# Patient Record
Sex: Female | Born: 1990 | Hispanic: Yes | Marital: Single | Smoking: Never smoker
Health system: Southern US, Community
[De-identification: ages and names within clinical notes are randomized; demographics above are authoritative.]

## PROBLEM LIST (undated history)

## (undated) DIAGNOSIS — N200 Calculus of kidney: Secondary | ICD-10-CM

## (undated) DIAGNOSIS — K759 Inflammatory liver disease, unspecified: Secondary | ICD-10-CM

## (undated) HISTORY — PX: KIDNEY STONE SURGERY: SHX686

---

## 2017-05-03 ENCOUNTER — Encounter (HOSPITAL_COMMUNITY): Payer: Self-pay

## 2017-05-03 ENCOUNTER — Emergency Department (HOSPITAL_COMMUNITY): Payer: Self-pay

## 2017-05-03 ENCOUNTER — Emergency Department (HOSPITAL_COMMUNITY)
Admission: EM | Admit: 2017-05-03 | Discharge: 2017-05-03 | Disposition: A | Discharge status: Home / Self Care | Payer: Self-pay | Attending: Emergency Medicine | Admitting: Emergency Medicine

## 2017-05-03 DIAGNOSIS — I722 Aneurysm of renal artery: Secondary | ICD-10-CM | POA: Insufficient documentation

## 2017-05-03 DIAGNOSIS — R110 Nausea: Secondary | ICD-10-CM | POA: Insufficient documentation

## 2017-05-03 DIAGNOSIS — R109 Unspecified abdominal pain: Secondary | ICD-10-CM

## 2017-05-03 DIAGNOSIS — Z87442 Personal history of urinary calculi: Secondary | ICD-10-CM | POA: Insufficient documentation

## 2017-05-03 DIAGNOSIS — N200 Calculus of kidney: Secondary | ICD-10-CM | POA: Insufficient documentation

## 2017-05-03 DIAGNOSIS — R300 Dysuria: Secondary | ICD-10-CM | POA: Insufficient documentation

## 2017-05-03 HISTORY — DX: Calculus of kidney: N20.0

## 2017-05-03 HISTORY — DX: Inflammatory liver disease, unspecified: K75.9

## 2017-05-03 LAB — URINALYSIS, ROUTINE W REFLEX MICROSCOPIC
Bilirubin Urine: NEGATIVE
Glucose, UA: NEGATIVE mg/dL
Ketones, ur: NEGATIVE mg/dL
Leukocytes, UA: NEGATIVE
Nitrite: NEGATIVE
Protein, ur: NEGATIVE mg/dL
RBC / HPF: NONE SEEN RBC/hpf (ref 0–5)
Specific Gravity, Urine: 1 — ABNORMAL LOW (ref 1.005–1.030)
pH: 7 (ref 5.0–8.0)

## 2017-05-03 LAB — CBC WITH DIFFERENTIAL/PLATELET
BASOS PCT: 0 %
Basophils Absolute: 0 10*3/uL (ref 0.0–0.1)
Eosinophils Absolute: 0.2 10*3/uL (ref 0.0–0.7)
Eosinophils Relative: 2 %
HEMATOCRIT: 36.7 % (ref 36.0–46.0)
Hemoglobin: 12 g/dL (ref 12.0–15.0)
LYMPHS ABS: 2.4 10*3/uL (ref 0.7–4.0)
LYMPHS PCT: 30 %
MCH: 28.1 pg (ref 26.0–34.0)
MCHC: 32.7 g/dL (ref 30.0–36.0)
MCV: 85.9 fL (ref 78.0–100.0)
MONO ABS: 0.7 10*3/uL (ref 0.1–1.0)
MONOS PCT: 9 %
NEUTROS ABS: 4.8 10*3/uL (ref 1.7–7.7)
Neutrophils Relative %: 59 %
Platelets: 333 10*3/uL (ref 150–400)
RBC: 4.27 MIL/uL (ref 3.87–5.11)
RDW: 13.2 % (ref 11.5–15.5)
WBC: 8 10*3/uL (ref 4.0–10.5)

## 2017-05-03 LAB — I-STAT BETA HCG BLOOD, ED (MC, WL, AP ONLY): I-stat hCG, quantitative: <5 m[IU]/mL (ref <5)

## 2017-05-03 LAB — COMPREHENSIVE METABOLIC PANEL
ALT: 9 U/L — ABNORMAL LOW (ref 14–54)
AST: 17 U/L (ref 15–41)
Albumin: 4.4 g/dL (ref 3.5–5.0)
Alkaline Phosphatase: 75 U/L (ref 38–126)
Anion gap: 5 (ref 5–15)
BUN: 10 mg/dL (ref 6–20)
CO2: 28 mmol/L (ref 22–32)
Calcium: 9.1 mg/dL (ref 8.9–10.3)
Chloride: 105 mmol/L (ref 101–111)
Creatinine, Ser: 0.74 mg/dL (ref 0.44–1.00)
GFR calc Af Amer: >60 mL/min (ref >60)
GFR calc non Af Amer: >60 mL/min (ref >60)
Glucose, Bld: 99 mg/dL (ref 65–99)
Potassium: 3.6 mmol/L (ref 3.5–5.1)
Sodium: 138 mmol/L (ref 135–145)
Total Bilirubin: 0.4 mg/dL (ref 0.3–1.2)
Total Protein: 7.4 g/dL (ref 6.5–8.1)

## 2017-05-03 LAB — LIPASE, BLOOD: Lipase: 33 U/L (ref 11–51)

## 2017-05-03 MED ORDER — HYDROCODONE-ACETAMINOPHEN 5-325 MG PO TABS: 1.0000 | ORAL_TABLET | Freq: Four times a day (QID) | ORAL | 0 refills | Status: DC | PRN

## 2017-05-03 MED ORDER — SODIUM CHLORIDE 0.9 % IV BOLUS (SEPSIS)
1000.0000 mL | Freq: Once | INTRAVENOUS | Status: AC
  Administered 2017-05-03: 1000 mL via INTRAVENOUS

## 2017-05-03 MED ORDER — TAMSULOSIN HCL 0.4 MG PO CAPS: 0.4000 mg | ORAL_CAPSULE | Freq: Every day | ORAL | 0 refills | Status: DC

## 2017-05-03 MED ORDER — ONDANSETRON HCL 8 MG PO TABS: 8.0000 mg | ORAL_TABLET | Freq: Three times a day (TID) | ORAL | 0 refills | Status: DC | PRN

## 2017-05-03 MED ORDER — NAPROXEN 500 MG PO TABS: 500.0000 mg | ORAL_TABLET | Freq: Two times a day (BID) | ORAL | 0 refills | Status: DC | PRN

## 2017-05-03 MED ORDER — ONDANSETRON HCL 4 MG/2ML IJ SOLN
4.0000 mg | Freq: Once | INTRAMUSCULAR | Status: AC
  Administered 2017-05-03: 4 mg via INTRAVENOUS
  Filled 2017-05-03: qty 2

## 2017-05-03 MED ORDER — KETOROLAC TROMETHAMINE 30 MG/ML IJ SOLN
15.0000 mg | Freq: Once | INTRAMUSCULAR | Status: AC
  Administered 2017-05-03: 15 mg via INTRAVENOUS
  Filled 2017-05-03: qty 1

## 2017-05-03 NOTE — ED Provider Notes (Signed)
[facility]-EMERGENCY DEPT Provider Note   CSN: [id number] Arrival date & time: 05/03/17  1427     History   Chief Complaint Chief Complaint  Patient presents with  . Abdominal Pain  . Back Pain    HPI Emily Madden Emily Madden is a 26 y.o. female with a PMHx of hepatitis and kidney stones, with PSHx of kidney stone surgery (L sided open stent placement for 3cm stone, and lithotripsy, done in 2014 in [place]), who is here visiting from [place], and presents to the ED with complaints of right flank pain that originally began on 04/30/17 in the early morning hours. She states that initially began on the right flank and then became more lower back radiating to her lower abdomen, she felt like this pain was similar to kidney stone pain so she drank a warm beer and took naproxen, and after about 2-3 hours her pain resolved. She felt well until around 11:40 AM her right flank pain began again and it was worse than previous, and feels exactly like prior kidney stones. She describes the pain is 8/10 waxing and waning constant sharp right flank pain that radiates to her lower abdomen, worse with movement, and unrelieved with naproxen. She reports associated dysuria and burning vaginal pain when she wipes, dark malodorous urine, and nausea. States her urine has cleared up a lot because she drank 4 bottles of water. LMP 04/22/17. Currently visiting from [place], doesn't have a PCP here. No other prior abd surgeries. Denies recent travel except from coming from [place], sick contacts, suspicious food intake, or frequent EtOH use.   She denies fevers, chills, CP, SOB, V/D/C, obstipation, hematuria, vaginal bleeding/discharge, myalgias, arthralgias, numbness, tingling, focal weakness, or any other complaints at this time.    The history is provided by the patient and medical records. A language interpreter was used (provider).  Flank Pain  This is a new problem. The current episode started more than 2 days ago. The  problem occurs constantly. The problem has been gradually worsening. Associated symptoms include abdominal pain. Pertinent negatives include no chest pain and no shortness of breath. Exacerbated by: movement. Nothing relieves the symptoms. Treatments tried: naprosyn. The treatment provided no relief.    Past Medical History:  Diagnosis Date  . Hepatitis   . Kidney stones     There are no active problems to display for this patient.   Past Surgical History:  Procedure Laterality Date  . KIDNEY STONE SURGERY      OB History    No data available       Home Medications    Prior to Admission medications   Not on File    Family History History reviewed. No pertinent family history.  Social History Social History  Substance Use Topics  . Smoking status: Never Smoker  . Smokeless tobacco: Never Used  . Alcohol use Yes     Comment: social     Allergies   Patient has no known allergies.   Review of Systems Review of Systems  Constitutional: Negative for chills and fever.  Respiratory: Negative for shortness of breath.   Cardiovascular: Negative for chest pain.  Gastrointestinal: Positive for abdominal pain and nausea. Negative for constipation, diarrhea and vomiting.  Genitourinary: Positive for dysuria, flank pain and vaginal pain (burning with wiping and urination). Negative for hematuria, vaginal bleeding and vaginal discharge.       +dark malodorous urine  Musculoskeletal: Negative for arthralgias and myalgias.  Skin: Negative for color change.  Allergic/Immunologic: Negative  for immunocompromised state.  Neurological: Negative for weakness and numbness.  Psychiatric/Behavioral: Negative for confusion.   All other systems reviewed and are negative for acute change except as noted in the HPI.    Physical Exam Updated Vital Signs BP 108/62 (BP Location: Left Arm)   Pulse 60   Temp (!) 97.5 F (36.4 C) (Oral)   Resp 16   LMP 04/23/2017   SpO2 100%    Physical Exam  Constitutional: She is oriented to person, place, and time. Vital signs are normal. She appears well-developed and well-nourished.  Non-toxic appearance. No distress.  Afebrile, nontoxic, NAD  HENT:  Head: Normocephalic and atraumatic.  Mouth/Throat: Oropharynx is clear and moist and mucous membranes are normal.  Eyes: Conjunctivae and EOM are normal. Right eye exhibits no discharge. Left eye exhibits no discharge.  Neck: Normal range of motion. Neck supple.  Cardiovascular: Normal rate, regular rhythm, normal heart sounds and intact distal pulses.  Exam reveals no gallop and no friction rub.   No murmur heard. Pulmonary/Chest: Effort normal and breath sounds normal. No respiratory distress. She has no decreased breath sounds. She has no wheezes. She has no rhonchi. She has no rales.  Abdominal: Soft. Normal appearance and bowel sounds are normal. She exhibits no distension. There is tenderness in the right lower quadrant. There is CVA tenderness. There is no rigidity, no rebound, no guarding, no tenderness at McBurney's point and negative Murphy's sign.    Soft, nondistended, +BS throughout, with mild R lateral abd TTP tracking towards the R flank area, minimal RLQ TTP mostly near the lateral abdomen, not near Mcburney's point, no r/g/r, neg murphy's, neg mcburney's point tenderness, +mild R sided CVA TTP but not very significant   Musculoskeletal: Normal range of motion.  Neurological: She is alert and oriented to person, place, and time. She has normal strength. No sensory deficit.  Skin: Skin is warm, dry and intact. No rash noted.  Psychiatric: She has a normal mood and affect.  Nursing note and vitals reviewed.    ED Treatments / Results  Labs (all labs ordered are listed, but only abnormal results are displayed) Labs Reviewed  COMPREHENSIVE METABOLIC PANEL - Abnormal; Notable for the following:       Result Value   ALT 9 (*)    All other components within  normal limits  URINALYSIS, ROUTINE W REFLEX MICROSCOPIC - Abnormal; Notable for the following:    Color, Urine COLORLESS (*)    Specific Gravity, Urine 1.000 (*)    Hgb urine dipstick SMALL (*)    Bacteria, UA RARE (*)    Squamous Epithelial / LPF 0-5 (*)    All other components within normal limits  URINE CULTURE  LIPASE, BLOOD  CBC WITH DIFFERENTIAL/PLATELET  I-STAT BETA HCG BLOOD, ED ([facility], [facility], [facility] ONLY)    EKG  EKG Interpretation None       Radiology Ct Renal Stone Study  Result Date: 05/03/2017 CLINICAL DATA:  Lower back pain since Wednesday. History kidney stones. Nausea. Dysuria. Evaluate for kidney stone versus appendicitis. EXAM: CT ABDOMEN AND PELVIS WITHOUT CONTRAST TECHNIQUE: Multidetector CT imaging of the abdomen and pelvis was performed following the standard protocol without IV contrast. COMPARISON:  None. FINDINGS: Lower chest: Clear lung bases. Normal heart size without pericardial or pleural effusion. Hepatobiliary: Normal liver. Normal gallbladder, without biliary ductal dilatation. Pancreas: Normal, without mass or ductal dilatation. Spleen: Normal in size, without focal abnormality. Adrenals/Urinary Tract: Normal adrenal glands.Bilateral punctate renal collecting system calculi. These  are most apparent on coronal reformatted images. No hydronephrosis. No hydroureter or ureteric calculi. No bladder calculi. Stomach/Bowel: Normal stomach, without wall thickening. Normal colon and terminal ileum. The appendix is suboptimally evaluated on this nondedicated study. Felt to be normal on image 61/ series 2. Normal small bowel. Vascular/Lymphatic: Normal caliber of the abdominal aorta. Suspect a a left renal artery aneurysm including at 8 mm on coronal image 69 and sagittal image 90. No abdominopelvic adenopathy. Reproductive: Normal uterus and adnexa. Other: Trace free pelvic fluid is likely physiologic. Musculoskeletal: No acute osseous abnormality. IMPRESSION: 1. Bilateral  nephrolithiasis, without obstructive uropathy. 2. Otherwise, low sensitivity exam secondary to stone study technique. 3. Appendix felt to be normal. 4. Suspicion of a left renal artery aneurysm, suboptimally evaluated. Consider nonemergent dedicated outpatient CTA or MRA characterization. Electronically Signed   By: Jeronimo Greaves M.D.   On: 05/03/2017 17:13    Procedures Procedures (including critical care time)  Medications Ordered in ED Medications  ketorolac (TORADOL) 30 MG/ML injection 15 mg (15 mg Intravenous Given 05/03/17 1555)  ondansetron (ZOFRAN) injection 4 mg (4 mg Intravenous Given 05/03/17 1555)  sodium chloride 0.9 % bolus 1,000 mL (1,000 mLs Intravenous New Bag/Given 05/03/17 1555)     Initial Impression / Assessment and Plan / ED Course  I have reviewed the triage vital signs and the nursing notes.  Pertinent labs & imaging results that were available during my care of the patient were reviewed by me and considered in my medical decision making (see chart for details).     26 y.o. female here with R flank pain onset 3 days ago, then resolved until this morning when it came back worse than before. Hx of kidney stones, states it feels the same. On exam, mild R lateral abd TTP tracking towards the R flank, mild RLQ TTP but not really near mcburney's point, nonperitoneal, mild R flank tenderness but not significant. Here visiting from [place]. Reports some vaginal burning with wiping and urinating, states it feels similar to prior kidney stones. No other vaginal complaints. No pelvic tenderness. Doubt need for pelvic exam, will start off with labs and CT renal study. Will give pain meds and nausea meds, then reassess shortly.   6:34 PM CBC w/diff WNL. CMP WNL. Lipase WNL. BetaHCG neg. U/A with neg nitrites and leuks, small Hgb, 0-5 squamous and WBC, no RBCs, rare bacteria; doubt UTI given this U/A, will send for culture. CT renal study showing b/l punctate renal stones, notably 2 small  stones at right UPJ on my interpretation of the CT; no ureterolithiasis, I suspect given her symptoms that she may have passed a stone recently. CT also shows likely L renal artery aneurysm which needs outpatient f/up-- this could be from her prior surgery, informed pt of this and need for f/up and she understands and states she'll f/up as soon as she gets back to her home country in the next 1-2wks. Pt feeling much better and tolerating PO well. Will send home with zofran, naprosyn, norco, and flomax. Urine strainer given. Advised staying hydrated. F/up with urology in 1-2wks but strict return precautions advised. [place] reviewed prior to dispensing controlled substance medications, and 1 year search was notable for: none found. Risks/benefits/alternatives and expectations discussed regarding controlled substances. Side effects of medications discussed. Informed consent obtained.  I explained the diagnosis and have given explicit precautions to return to the ER including for any other new or worsening symptoms. The patient understands and accepts the medical plan  as it's been dictated and I have answered their questions. Discharge instructions concerning home care and prescriptions have been given. The patient is STABLE and is discharged to home in good condition.    Final Clinical Impressions(s) / ED Diagnoses   Final diagnoses:  Right flank pain  Nausea  Dysuria  Nephrolithiasis  Aneurysm of left renal artery (HCC)    New Prescriptions New Prescriptions   HYDROCODONE-ACETAMINOPHEN (NORCO) 5-325 MG TABLET    Take 1 tablet by mouth every 6 (six) hours as needed for severe pain.   NAPROXEN (NAPROSYN) 500 MG TABLET    Take 1 tablet (500 mg total) by mouth 2 (two) times daily as needed for mild pain, moderate pain or headache (TAKE WITH MEALS.).   ONDANSETRON (ZOFRAN) 8 MG TABLET    Take 1 tablet (8 mg total) by mouth every 8 (eight) hours as needed for nausea or vomiting.   TAMSULOSIN  (FLOMAX) 0.4 MG CAPS CAPSULE    Take 1 capsule (0.4 mg total) by mouth daily after supper. Use until stone passes, then stop using     [address], ReddellMercedes, [place] JerseyPA-C 05/03/17 Sharyne Richters1835    Kohut, Stephen, MD 05/04/17 [phone]

## 2017-05-03 NOTE — Discharge Instructions (Addendum)
Take naprosyn as directed as needed for pain using norco for breakthrough pain. Do not drive or operate machinery with pain medication use. May need over-the-counter stool softener with this pain medication use. Use Zofran as needed for nausea. Use Flomax as directed, as this medication will help you pass the stone. Strain all urine to try to catch the stone when it passes. Follow-up with your regular doctors when you return to your country, in the next 1-2 weeks, or you can follow up with the urologist listed above in the next 1 to 2 weeks for recheck of ongoing pain. You will need to follow up with your doctors when you go home because your CT today showed an abnormality of the left kidney that needs ongoing management. For intractable or uncontrollable pain at home [facility]

## 2017-05-03 NOTE — ED Notes (Signed)
Lab notified that urine culture has been added on, [facility] states will add.

## 2017-05-03 NOTE — ED Triage Notes (Signed)
Pt having lower back pain since Wednesday.  Hx of kidney stones and feels the same.  Nausea with no vomiting.  No fever.  Some difficulty urinating and dark prior to today.

## 2017-05-05 LAB — URINE CULTURE: Culture: <10000 — AB

## 2018-02-04 ENCOUNTER — Encounter (HOSPITAL_COMMUNITY): Payer: Self-pay | Admitting: Emergency Medicine

## 2018-02-04 ENCOUNTER — Emergency Department (HOSPITAL_COMMUNITY)
Admission: EM | Admit: 2018-02-04 | Discharge: 2018-02-04 | Disposition: A | Discharge status: Home / Self Care | Payer: Self-pay | Attending: Emergency Medicine | Admitting: Emergency Medicine

## 2018-02-04 ENCOUNTER — Emergency Department (HOSPITAL_COMMUNITY): Payer: Self-pay

## 2018-02-04 DIAGNOSIS — R110 Nausea: Secondary | ICD-10-CM | POA: Insufficient documentation

## 2018-02-04 DIAGNOSIS — R200 Anesthesia of skin: Secondary | ICD-10-CM | POA: Insufficient documentation

## 2018-02-04 DIAGNOSIS — G43809 Other migraine, not intractable, without status migrainosus: Secondary | ICD-10-CM | POA: Insufficient documentation

## 2018-02-04 DIAGNOSIS — R42 Dizziness and giddiness: Secondary | ICD-10-CM | POA: Insufficient documentation

## 2018-02-04 LAB — POC URINE PREG, ED: Preg Test, Ur: NEGATIVE

## 2018-02-04 MED ORDER — DIPHENHYDRAMINE HCL 50 MG/ML IJ SOLN
25.0000 mg | Freq: Once | INTRAMUSCULAR | Status: AC
  Administered 2018-02-04: 25 mg via INTRAVENOUS
  Filled 2018-02-04: qty 1

## 2018-02-04 MED ORDER — SODIUM CHLORIDE 0.9 % IV BOLUS
1000.0000 mL | Freq: Once | INTRAVENOUS | Status: AC
  Administered 2018-02-04: 1000 mL via INTRAVENOUS

## 2018-02-04 MED ORDER — PROCHLORPERAZINE EDISYLATE 5 MG/ML IJ SOLN
10.0000 mg | Freq: Once | INTRAMUSCULAR | Status: AC
  Administered 2018-02-04: 10 mg via INTRAVENOUS
  Filled 2018-02-04: qty 2

## 2018-02-04 NOTE — ED Notes (Signed)
All instructions and communication were relayed through [facility].

## 2018-02-04 NOTE — ED Triage Notes (Signed)
Pt c/o dizziness and headache around eyes since last Thursday. Sometimes has blurry vision. Reports tingling in body yesterday as well as nausea but no vomiting.

## 2018-02-04 NOTE — ED Provider Notes (Signed)
[facility]-EMERGENCY DEPT Provider Note   CSN: [id number] Arrival date & time: 02/04/18  1010     History   Chief Complaint Chief Complaint  Patient presents with  . Dizziness  . Headache  . Tingling    HPI Emily Madden [name] is a 27 y.o. female.  HPI   Presents with concern for dizziness, headache, right sided headache, also forehead and eyes since yesterday Blurred vision In the morning felt like whole body was tingling Last Thursday had severe headache, made want to vomit Had history of headache about 40yrs ago, was diagnosed with migraine but had no other issues Started slowly around the eyes and forehead then worsened to include rest of head Dizziness feels like lightheaded and if focusing vision it is blurriness, also tingling in both hands Throbbing pain in back of right side of head Headache constant, nothing makes it worse When standing lightheadedness is worse Tried naproxen for headache, helped a little bit Headache is worse with bright lights and loud sounds Nausea but no vomiting, not wanting to eat much Mother's aunt had aneurysm, other had stroke, no immediate family hx  Spanish speaking, used interpreter  Past Medical History:  Diagnosis Date  . Hepatitis   . Kidney stones     There are no active problems to display for this patient.   Past Surgical History:  Procedure Laterality Date  . KIDNEY STONE SURGERY       OB History   None      Home Medications    Prior to Admission medications   Medication Sig Start Date End Date Taking? Authorizing Provider  ibuprofen (ADVIL,MOTRIN) 200 MG tablet Take 200 mg by mouth every 6 (six) hours as needed for moderate pain.    Yes [provider]  HYDROcodone-acetaminophen (NORCO) 5-325 MG tablet Take 1 tablet by mouth every 6 (six) hours as needed for severe pain. Patient not taking: Reported on 02/04/2018 05/03/17   Street, [place], PA-C  naproxen (NAPROSYN) 500 MG  tablet Take 1 tablet (500 mg total) by mouth 2 (two) times daily as needed for mild pain, moderate pain or headache (TAKE WITH MEALS.). Patient not taking: Reported on 02/04/2018 05/03/17   Street, [place], PA-C  ondansetron (ZOFRAN) 8 MG tablet Take 1 tablet (8 mg total) by mouth every 8 (eight) hours as needed for nausea or vomiting. Patient not taking: Reported on 02/04/2018 05/03/17   Street, [place], PA-C  tamsulosin (FLOMAX) 0.4 MG CAPS capsule Take 1 capsule (0.4 mg total) by mouth daily after supper. Use until stone passes, then stop using Patient not taking: Reported on 02/04/2018 05/03/17   Street, [place], PA-C    Family History No family history on file.  Social History Social History   Tobacco Use  . Smoking status: Never Smoker  . Smokeless tobacco: Never Used  Substance Use Topics  . Alcohol use: Yes    Comment: social  . Drug use: No     Allergies   Patient has no known allergies.   Review of Systems Review of Systems  Constitutional: Positive for fatigue. Negative for fever.  HENT: Negative for congestion (15 days ago cold now resolved) and sore throat.   Eyes: Negative for visual disturbance.  Respiratory: Negative for cough and shortness of breath.   Cardiovascular: Negative for chest pain.  Gastrointestinal: Positive for nausea. Negative for abdominal pain, diarrhea and vomiting.  Genitourinary: Negative for difficulty urinating.  Musculoskeletal: Negative for back pain and neck pain.  Skin: Negative  for rash.  Neurological: Positive for dizziness, numbness (tingling over body, both hands) and headaches. Negative for syncope, facial asymmetry, speech difficulty and weakness.     Physical Exam Updated Vital Signs BP 100/67 (BP Location: Left Arm)   Pulse 68   Temp 98.2 F (36.8 C) (Oral)   Resp 16   LMP 02/02/2018   SpO2 100%   Physical Exam  Constitutional: She is oriented to person, place, and time. She appears well-developed and well-nourished. No  distress.  HENT:  Head: Normocephalic and atraumatic.  Eyes: Conjunctivae and EOM are normal.  Neck: Normal range of motion.  Cardiovascular: Normal rate, regular rhythm, normal heart sounds and intact distal pulses. Exam reveals no gallop and no friction rub.  No murmur heard. Pulmonary/Chest: Effort normal and breath sounds normal. No respiratory distress. She has no wheezes. She has no rales.  Abdominal: Soft. She exhibits no distension. There is no tenderness. There is no guarding.  Musculoskeletal: She exhibits no edema or tenderness.  Neurological: She is alert and oriented to person, place, and time. She has normal strength. No cranial nerve deficit or sensory deficit. Coordination and gait normal. GCS eye subscore is 4. GCS verbal subscore is 5. GCS motor subscore is 6.  Skin: Skin is warm and dry. No rash noted. She is not diaphoretic. No erythema.  Nursing note and vitals reviewed.    ED Treatments / Results  Labs (all labs ordered are listed, but only abnormal results are displayed) Labs Reviewed  POC URINE PREG, ED    EKG None  Radiology Ct Head Wo Contrast  Result Date: 02/04/2018 CLINICAL DATA:  Headaches EXAM: CT HEAD WITHOUT CONTRAST TECHNIQUE: Contiguous axial images were obtained from the base of the skull through the vertex without intravenous contrast. COMPARISON:  None. FINDINGS: Brain: No evidence of acute infarction, hemorrhage, hydrocephalus, extra-axial collection or mass lesion/mass effect. Vascular: No hyperdense vessel or unexpected calcification. Skull: Normal. Negative for fracture or focal lesion. Sinuses/Orbits: Small air-fluid level is noted within the left maxillary antrum. Other: None IMPRESSION: No acute intracranial abnormality noted. Small air-fluid level within the left maxillary antrum consistent with acute sinusitis. Electronically Signed   By: Alcide Clever M.D.   On: 02/04/2018 12:48    Procedures Procedures (including critical care  time)  Medications Ordered in ED Medications  sodium chloride 0.9 % bolus 1,000 mL (0 mLs Intravenous Stopped 02/04/18 1348)  prochlorperazine (COMPAZINE) injection 10 mg (10 mg Intravenous Given 02/04/18 1221)  diphenhydrAMINE (BENADRYL) injection 25 mg (25 mg Intravenous Given 02/04/18 1221)     Initial Impression / Assessment and Plan / ED Course  I have reviewed the triage vital signs and the nursing notes.  Pertinent labs & imaging results that were available during my care of the patient were reviewed by me and considered in my medical decision making (see chart for details).     26yo female presents with concern for headache.  Headache began slowly, no trauma, no fevers, and normal neurologic exam and have low suspicion for [facility], SDH or meningitis.  CT head without acute abnormalities. No symptoms to suggest sinusitis.  Patient was given compazine and benadryl with improvement in headache.  Recommend PCP follow up.  Patient discharged in stable condition with understanding of reasons to return.    Final Clinical Impressions(s) / ED Diagnoses   Final diagnoses:  Other migraine without status migrainosus, not intractable    ED Discharge Orders    None       Medina Degraffenreid,  Denny PeonErin, MD 02/04/18 2217

## 2019-02-16 ENCOUNTER — Encounter: Payer: Self-pay | Admitting: Nurse Practitioner

## 2019-02-16 ENCOUNTER — Other Ambulatory Visit: Payer: Self-pay

## 2019-02-16 ENCOUNTER — Ambulatory Visit: Payer: Self-pay | Attending: Nurse Practitioner | Admitting: Nurse Practitioner

## 2019-02-16 DIAGNOSIS — Z0000 Encounter for general adult medical examination without abnormal findings: Secondary | ICD-10-CM

## 2019-02-16 NOTE — Progress Notes (Signed)
Virtual Visit via Telephone Note  I connected with Emily Madden on 02/16/19  at   2:20 PM EDT  EDT by telephone and verified that I am speaking with the correct person using two identifiers.   Consent I discussed the limitations, risks, security and privacy concerns of performing an evaluation and management service by telephone and the availability of in person appointments. I also discussed with the patient that there may be a patient responsible charge related to this service. The patient expressed understanding and agreed to proceed.   Location of Patient:  Private Residence    Location of Provider: [place] and [place] Office    Persons participating in Telemedicine visit: Geryl Rankins FNP-BC [place]  Interpreter [place] #[id number]   History of Present Illness: Telemedicine provided today to establish care. She has a history of left kidney stones (Lithotripsy 3 years ago). Also re occurrence of kidney stones in right kidney (2019) and was given flomax with significant resolution of stones. Last PAP Smear was 4 years ago and normal.     Observations/Objective: Awake, alert and oriented x 3.     Assessment and Plan: Emily Madden was seen today for new patient (initial visit).  Diagnoses and all orders for this visit:  Routine adult health maintenance -     CBC -     CMP14+EGFR -     Lipid panel     Follow Up Instructions Return if symptoms worsen or fail to improve.     I discussed the assessment and treatment plan with the patient. The patient was provided an opportunity to ask questions and all were answered. The patient agreed with the plan and demonstrated an understanding of the instructions.   The patient was advised to call back or seek an in-person evaluation if the symptoms worsen or if the condition fails to improve as anticipated.  I provided 10 minutes of non-face-to-face time during this encounter including median  intraservice time, reviewing previous notes, labs, imaging, medications and explaining diagnosis and management.  Gildardo Pounds, FNP-BC

## 2019-02-18 ENCOUNTER — Other Ambulatory Visit: Payer: Self-pay

## 2019-02-18 ENCOUNTER — Ambulatory Visit: Payer: Self-pay | Attending: Nurse Practitioner

## 2019-02-19 LAB — LIPID PANEL
Chol/HDL Ratio: 2.7 ratio (ref 0.0–4.4)
Cholesterol, Total: 177 mg/dL (ref 100–199)
HDL: 65 mg/dL (ref >39)
LDL Calculated: 95 mg/dL (ref 0–99)
Triglycerides: 86 mg/dL (ref 0–149)
VLDL Cholesterol Cal: 17 mg/dL (ref 5–40)

## 2019-02-19 LAB — CMP14+EGFR
ALT: 9 IU/L (ref 0–32)
AST: 19 IU/L (ref 0–40)
Albumin/Globulin Ratio: 1.8 (ref 1.2–2.2)
Albumin: 4.6 g/dL (ref 3.9–5.0)
Alkaline Phosphatase: 87 IU/L (ref 39–117)
BUN/Creatinine Ratio: 16 (ref 9–23)
BUN: 12 mg/dL (ref 6–20)
Bilirubin Total: 0.5 mg/dL (ref 0.0–1.2)
CO2: 21 mmol/L (ref 20–29)
Calcium: 9.5 mg/dL (ref 8.7–10.2)
Chloride: 100 mmol/L (ref 96–106)
Creatinine, Ser: 0.73 mg/dL (ref 0.57–1.00)
GFR calc Af Amer: 130 mL/min/{1.73_m2} (ref >59)
GFR calc non Af Amer: 112 mL/min/{1.73_m2} (ref >59)
Globulin, Total: 2.6 g/dL (ref 1.5–4.5)
Glucose: 89 mg/dL (ref 65–99)
Potassium: 4.6 mmol/L (ref 3.5–5.2)
Sodium: 138 mmol/L (ref 134–144)
Total Protein: 7.2 g/dL (ref 6.0–8.5)

## 2019-02-19 LAB — CBC
Hematocrit: 39.7 % (ref 34.0–46.6)
Hemoglobin: 12.8 g/dL (ref 11.1–15.9)
MCH: 26.8 pg (ref 26.6–33.0)
MCHC: 32.2 g/dL (ref 31.5–35.7)
MCV: 83 fL (ref 79–97)
Platelets: 304 10*3/uL (ref 150–450)
RBC: 4.78 x10E6/uL (ref 3.77–5.28)
RDW: 12.2 % (ref 11.7–15.4)
WBC: 9.7 10*3/uL (ref 3.4–10.8)

## 2019-02-23 ENCOUNTER — Telehealth: Payer: Self-pay

## 2019-02-23 NOTE — Telephone Encounter (Signed)
-----   Message from Claiborne Rigg, NP sent at 02/21/2019  1:43 PM EDT ----- All of your labs are normal. There is no anemia. Kidney, liver function and cholesterol levels are all normal.

## 2019-02-23 NOTE — Telephone Encounter (Signed)
CMA spoke to patient to inform on lab results.  Pt. Verified DOB. Pt. Understood.  Spanish interpreter Josimith [phone] assist with the call.

## 2020-06-08 ENCOUNTER — Other Ambulatory Visit: Payer: Self-pay

## 2020-06-08 ENCOUNTER — Encounter (HOSPITAL_COMMUNITY): Payer: Self-pay | Admitting: Emergency Medicine

## 2020-06-08 ENCOUNTER — Emergency Department (HOSPITAL_COMMUNITY)
Admission: EM | Admit: 2020-06-08 | Discharge: 2020-06-08 | Disposition: A | Discharge status: Home / Self Care | Payer: Self-pay | Attending: Emergency Medicine | Admitting: Emergency Medicine

## 2020-06-08 ENCOUNTER — Telehealth: Payer: Self-pay | Admitting: *Deleted

## 2020-06-08 ENCOUNTER — Emergency Department (HOSPITAL_COMMUNITY): Payer: Self-pay

## 2020-06-08 DIAGNOSIS — N3000 Acute cystitis without hematuria: Secondary | ICD-10-CM | POA: Insufficient documentation

## 2020-06-08 DIAGNOSIS — Z20822 Contact with and (suspected) exposure to covid-19: Secondary | ICD-10-CM | POA: Insufficient documentation

## 2020-06-08 DIAGNOSIS — Z79899 Other long term (current) drug therapy: Secondary | ICD-10-CM | POA: Insufficient documentation

## 2020-06-08 LAB — URINALYSIS, ROUTINE W REFLEX MICROSCOPIC
Bilirubin Urine: NEGATIVE
Glucose, UA: NEGATIVE mg/dL
Hgb urine dipstick: NEGATIVE
Ketones, ur: NEGATIVE mg/dL
Nitrite: NEGATIVE
Protein, ur: NEGATIVE mg/dL
Specific Gravity, Urine: 1.003 — ABNORMAL LOW (ref 1.005–1.030)
pH: 5 (ref 5.0–8.0)

## 2020-06-08 LAB — CBC
HCT: 42.9 % (ref 36.0–46.0)
Hemoglobin: 13.8 g/dL (ref 12.0–15.0)
MCH: 27.7 pg (ref 26.0–34.0)
MCHC: 32.2 g/dL (ref 30.0–36.0)
MCV: 86 fL (ref 80.0–100.0)
Platelets: 300 10*3/uL (ref 150–400)
RBC: 4.99 MIL/uL (ref 3.87–5.11)
RDW: 13 % (ref 11.5–15.5)
WBC: 7.1 10*3/uL (ref 4.0–10.5)
nRBC: 0 % (ref 0.0–0.2)

## 2020-06-08 LAB — BASIC METABOLIC PANEL
Anion gap: 9 (ref 5–15)
BUN: 13 mg/dL (ref 6–20)
CO2: 22 mmol/L (ref 22–32)
Calcium: 9.1 mg/dL (ref 8.9–10.3)
Chloride: 105 mmol/L (ref 98–111)
Creatinine, Ser: 0.73 mg/dL (ref 0.44–1.00)
GFR calc Af Amer: >60 mL/min (ref >60)
GFR calc non Af Amer: >60 mL/min (ref >60)
Glucose, Bld: 94 mg/dL (ref 70–99)
Potassium: 3.9 mmol/L (ref 3.5–5.1)
Sodium: 136 mmol/L (ref 135–145)

## 2020-06-08 LAB — I-STAT BETA HCG BLOOD, ED (MC, WL, AP ONLY): I-stat hCG, quantitative: <5 m[IU]/mL (ref <5)

## 2020-06-08 LAB — HEPATIC FUNCTION PANEL
ALT: 10 U/L (ref 0–44)
AST: 14 U/L — ABNORMAL LOW (ref 15–41)
Albumin: 4.2 g/dL (ref 3.5–5.0)
Alkaline Phosphatase: 80 U/L (ref 38–126)
Bilirubin, Direct: <0.1 mg/dL (ref 0.0–0.2)
Total Bilirubin: 0.5 mg/dL (ref 0.3–1.2)
Total Protein: 7.8 g/dL (ref 6.5–8.1)

## 2020-06-08 LAB — SARS CORONAVIRUS 2 BY RT PCR (HOSPITAL ORDER, PERFORMED IN ~~LOC~~ HOSPITAL LAB): SARS Coronavirus 2: NEGATIVE

## 2020-06-08 MED ORDER — CEPHALEXIN 500 MG PO CAPS
500.0000 mg | ORAL_CAPSULE | Freq: Four times a day (QID) | ORAL | 0 refills | Status: DC
Start: 2020-06-08 — End: 2021-01-08

## 2020-06-08 MED ORDER — CEPHALEXIN 500 MG PO CAPS
500.0000 mg | ORAL_CAPSULE | Freq: Four times a day (QID) | ORAL | 0 refills | Status: DC
Start: 2020-06-08 — End: 2020-06-08

## 2020-06-08 MED ORDER — HYDROCODONE-ACETAMINOPHEN 5-325 MG PO TABS: 1.0000 | ORAL_TABLET | Freq: Four times a day (QID) | ORAL | 0 refills | Status: DC | PRN

## 2020-06-08 MED ORDER — ONDANSETRON 4 MG PO TBDP
ORAL_TABLET | ORAL | 0 refills | Status: DC
Start: 2020-06-08 — End: 2020-06-08

## 2020-06-08 MED ORDER — HYDROMORPHONE HCL 1 MG/ML IJ SOLN
0.5000 mg | Freq: Once | INTRAMUSCULAR | Status: AC
  Administered 2020-06-08: 0.5 mg via INTRAVENOUS
  Filled 2020-06-08: qty 1

## 2020-06-08 MED ORDER — HYDROCODONE-ACETAMINOPHEN 5-325 MG PO TABS
1.0000 | ORAL_TABLET | Freq: Once | ORAL | Status: AC
  Administered 2020-06-08: 1 via ORAL
  Filled 2020-06-08: qty 1

## 2020-06-08 MED ORDER — SODIUM CHLORIDE 0.9 % IV SOLN
1.0000 g | Freq: Once | INTRAVENOUS | Status: AC
  Administered 2020-06-08: 1 g via INTRAVENOUS
  Filled 2020-06-08: qty 10

## 2020-06-08 MED ORDER — ONDANSETRON 4 MG PO TBDP
ORAL_TABLET | ORAL | 0 refills | Status: DC
Start: 2020-06-08 — End: 2021-01-08

## 2020-06-08 MED ORDER — ONDANSETRON HCL 4 MG/2ML IJ SOLN
4.0000 mg | Freq: Once | INTRAMUSCULAR | Status: AC
  Administered 2020-06-08: 4 mg via INTRAVENOUS
  Filled 2020-06-08: qty 2

## 2020-06-08 MED FILL — ONDANSETRON ODT 4 MG TABLET: 4 | 2 days supply | Qty: 10 | Fill #0

## 2020-06-08 MED FILL — CEPHALEXIN 500 MG CAPSULE: 500 | 7 days supply | Qty: 28 | Fill #0

## 2020-06-08 NOTE — Discharge Instructions (Signed)
Follow-up with [facility] next week.  Return if any problems 

## 2020-06-08 NOTE — ED Triage Notes (Signed)
interperter [id number]. Patient is complaining of right flank pain. Patient has a hx of kidney stones.

## 2020-06-08 NOTE — ED Provider Notes (Signed)
[facility]-EMERGENCY DEPT Provider Note   CSN: [id number] Arrival date & time: 06/08/20  4098     History Chief Complaint  Patient presents with  . Flank Pain    Emily Madden is a 29 y.o. female.  Patient complains of right lower quadrant pain.  Also some right flank pain.  Mild nausea  The history is provided by the patient. No language interpreter was used.  Flank Pain This is a new problem. The current episode started 6 to 12 hours ago. The problem occurs constantly. The problem has not changed since onset.Pertinent negatives include no chest pain, no abdominal pain and no headaches. Nothing aggravates the symptoms. Nothing relieves the symptoms. She has tried nothing for the symptoms. The treatment provided no relief.       Past Medical History:  Diagnosis Date  . Hepatitis   . Kidney stones     There are no problems to display for this patient.   Past Surgical History:  Procedure Laterality Date  . KIDNEY STONE SURGERY     left kidney      OB History   No obstetric history on file.     Family History  Problem Relation Age of Onset  . Hypertension Mother   . Hyperlipidemia Mother   . Breast cancer Mother   . Stroke Maternal Aunt   . Stroke Maternal Aunt     Social History   Tobacco Use  . Smoking status: Never Smoker  . Smokeless tobacco: Never Used  Vaping Use  . Vaping Use: Never used  Substance Use Topics  . Alcohol use: Yes    Comment: social  . Drug use: No    Home Medications Prior to Admission medications   Medication Sig Start Date End Date Taking? Authorizing Provider  cephALEXin (KEFLEX) 500 MG capsule Take 1 capsule (500 mg total) by mouth 4 (four) times daily. 06/08/20   Bethann Berkshire, MD  HYDROcodone-acetaminophen (NORCO/VICODIN) 5-325 MG tablet Take 1 tablet by mouth every 6 (six) hours as needed for moderate pain. 06/08/20   Bethann Berkshire, MD  ibuprofen (ADVIL,MOTRIN) 200 MG tablet Take 200 mg by  mouth every 6 (six) hours as needed for moderate pain.     [provider]  ondansetron (ZOFRAN ODT) 4 MG disintegrating tablet 4mg  ODT q4 hours prn nausea/vomit 06/08/20   08/08/20, MD    Allergies    Patient has no known allergies.  Review of Systems   Review of Systems  Constitutional: Negative for appetite change and fatigue.  HENT: Negative for congestion, ear discharge and sinus pressure.   Eyes: Negative for discharge.  Respiratory: Negative for cough.   Cardiovascular: Negative for chest pain.  Gastrointestinal: Negative for abdominal pain and diarrhea.  Genitourinary: Positive for flank pain. Negative for frequency and hematuria.  Musculoskeletal: Negative for back pain.  Skin: Negative for rash.  Neurological: Negative for seizures and headaches.  Psychiatric/Behavioral: Negative for hallucinations.    Physical Exam Updated Vital Signs BP 99/67   Pulse 61   Temp 98.2 F (36.8 C) (Oral)   Resp 16   Ht 5\' 5"  (1.651 m)   Wt 63.3 kg   LMP 05/11/2020 Comment: negative beta HCG 06/08/20  SpO2 100%   BMI 23.22 kg/m   Physical Exam Vitals and nursing note reviewed.  Constitutional:      Appearance: She is well-developed.  HENT:     Head: Normocephalic.     Nose: Nose normal.  Eyes:  General: No scleral icterus.    Conjunctiva/sclera: Conjunctivae normal.  Neck:     Thyroid: No thyromegaly.  Cardiovascular:     Rate and Rhythm: Normal rate and regular rhythm.     Heart sounds: No murmur heard.  No friction rub. No gallop.   Pulmonary:     Breath sounds: No stridor. No wheezing or rales.  Chest:     Chest wall: No tenderness.  Abdominal:     General: There is no distension.     Tenderness: There is no abdominal tenderness. There is no rebound.  Genitourinary:    Comments: Tender right flank Musculoskeletal:        General: Normal range of motion.     Cervical back: Neck supple.  Lymphadenopathy:     Cervical: No cervical adenopathy.    Skin:    Findings: No erythema or rash.  Neurological:     Mental Status: She is alert and oriented to person, place, and time.     Motor: No abnormal muscle tone.     Coordination: Coordination normal.  Psychiatric:        Behavior: Behavior normal.     ED Results / Procedures / Treatments   Labs (all labs ordered are listed, but only abnormal results are displayed) Labs Reviewed  URINALYSIS, ROUTINE W REFLEX MICROSCOPIC - Abnormal; Notable for the following components:      Result Value   Color, Urine STRAW (*)    Specific Gravity, Urine 1.003 (*)    Leukocytes,Ua LARGE (*)    Bacteria, UA RARE (*)    All other components within normal limits  HEPATIC FUNCTION PANEL - Abnormal; Notable for the following components:   AST 14 (*)    All other components within normal limits  SARS CORONAVIRUS 2 BY RT PCR (HOSPITAL ORDER, PERFORMED IN [facility])  URINE CULTURE  BASIC METABOLIC PANEL  CBC  I-STAT BETA HCG BLOOD, ED ([facility], [facility], [facility] ONLY)    EKG None  Radiology CT Renal Stone Study  Result Date: 06/08/2020 CLINICAL DATA:  Right flank pain. EXAM: CT ABDOMEN AND PELVIS WITHOUT CONTRAST TECHNIQUE: Multidetector CT imaging of the abdomen and pelvis was performed following the standard protocol without IV contrast. COMPARISON:  CT scan 05/03/2017 FINDINGS: Lower chest: Minimal streaky dependent subpleural atelectasis. No pulmonary lesions or pleural effusion. The heart is normal in size. Hepatobiliary: No hepatic lesions are identified without contrast. The gallbladder appears normal. No intra or extrahepatic biliary dilatation. Pancreas: No mass, inflammation or ductal dilatation. Spleen: Normal size. No focal lesions. Adrenals/Urinary Tract: The adrenal glands are normal. Small lower pole left renal calculus. No obstructing ureteral calculi or bladder calculi. No worrisome renal lesions are identified without contrast. The bladder appears normal. Calcifications near the  left renal hilum possibly stable rim calcified renal artery aneurysm but also it could be in the renal vein and reflect prior renal vein thrombosis. Stomach/Bowel: The stomach, duodenum, small bowel and colon are grossly normal without oral contrast. No inflammatory changes, mass lesions or obstructive findings. The appendix is normal. Vascular/Lymphatic: The aorta is normal in caliber. No atheroscerlotic calcifications. No mesenteric of retroperitoneal mass or adenopathy. Small scattered lymph nodes are noted. Reproductive: The uterus and ovaries are normal. Other: No pelvic mass or adenopathy. No free pelvic fluid collections. No inguinal mass or adenopathy. No abdominal wall hernia or subcutaneous lesions. Musculoskeletal: No significant bony findings. IMPRESSION: 1. Small lower pole left renal calculus but no obstructing ureteral calculi or bladder calculi.  2. No acute abdominal/pelvic findings, mass lesions or adenopathy. Electronically Signed   By: Rudie Meyer M.D.   On: 06/08/2020 08:46    Procedures Procedures (including critical care time)  Medications Ordered in ED Medications  HYDROcodone-acetaminophen (NORCO/VICODIN) 5-325 MG per tablet 1 tablet (has no administration in time range)  HYDROmorphone (DILAUDID) injection 0.5 mg (0.5 mg Intravenous Given 06/08/20 0827)  ondansetron (ZOFRAN) injection 4 mg (4 mg Intravenous Given 06/08/20 0833)  cefTRIAXone (ROCEPHIN) 1 g in sodium chloride 0.9 % 100 mL IVPB (1 g Intravenous New Bag/Given 06/08/20 1036)    ED Course  I have reviewed the triage vital signs and the nursing notes.  Pertinent labs & imaging results that were available during my care of the patient were reviewed by me and considered in my medical decision making (see chart for details).    MDM Rules/Calculators/A&P                          Patient with urinary tract infection and kidney stone in her right kidney.  She will be discharged home with Keflex Zofran and hydrocodone  referred to urology       This patient presents to the ED for concern of right flank pain, this involves an extensive number of treatment options, and is a complaint that carries with it a high risk of complications and morbidity.  The differential diagnosis includes kidney stone or UTI   Lab Tests:   I Ordered, reviewed, and interpreted labs, which included CBC chemistries urinalysis.  CBC and chemistries unremarkable urinalysis suggest UTI  Medicines ordered:   I ordered medication Rocephin for UTI  Imaging Studies ordered:   I ordered imaging studies which included CT renal and  I independently visualized and interpreted imaging which showed kidney stone in the right kidney  Additional history obtained:   Additional history obtained from records  Previous records obtained and reviewed.  Consultations Obtained:     Reevaluation:  After the interventions stated above, I reevaluated the patient and found improved  Critical Interventions:  .   Final Clinical Impression(s) / ED Diagnoses Final diagnoses:  Acute cystitis without hematuria    Rx / DC Orders ED Discharge Orders         Ordered    cephALEXin (KEFLEX) 500 MG capsule  4 times daily     Discontinue  Reprint     06/08/20 1118    ondansetron (ZOFRAN ODT) 4 MG disintegrating tablet     Discontinue  Reprint     06/08/20 1118    HYDROcodone-acetaminophen (NORCO/VICODIN) 5-325 MG tablet  Every 6 hours PRN     Discontinue  Reprint     06/08/20 1118           Bethann Berkshire, MD 06/08/20 1130

## 2020-06-08 NOTE — Telephone Encounter (Signed)
Pharmacy called regarding not being able to fill narcotics.  RNCM will contact EDP to send to desired pharmacy [facility]).

## 2020-06-09 LAB — URINE CULTURE: Culture: <10000 — AB

## 2020-10-04 ENCOUNTER — Ambulatory Visit: Payer: Self-pay | Admitting: Physician Assistant

## 2020-11-15 ENCOUNTER — Ambulatory Visit: Payer: Self-pay | Admitting: Physician Assistant

## 2020-12-05 ENCOUNTER — Encounter: Payer: Self-pay | Admitting: Nurse Practitioner

## 2020-12-05 ENCOUNTER — Other Ambulatory Visit: Payer: Self-pay

## 2020-12-05 ENCOUNTER — Ambulatory Visit: Payer: Self-pay | Attending: Physician Assistant | Admitting: Nurse Practitioner

## 2020-12-05 DIAGNOSIS — E785 Hyperlipidemia, unspecified: Secondary | ICD-10-CM

## 2020-12-05 DIAGNOSIS — R5382 Chronic fatigue, unspecified: Secondary | ICD-10-CM

## 2020-12-05 DIAGNOSIS — E559 Vitamin D deficiency, unspecified: Secondary | ICD-10-CM

## 2020-12-05 NOTE — Addendum Note (Signed)
Addended by: Shelly Rubenstein on: 12/05/2020 02:05 PM   Modules accepted: Orders

## 2020-12-05 NOTE — Progress Notes (Signed)
Virtual Visit via Telephone Note Due to national recommendations of social distancing due to [place] 19, telehealth visit is felt to be most appropriate for this patient at this time.  I discussed the limitations, risks, security and privacy concerns of performing an evaluation and management service by telephone and the availability of in person appointments. I also discussed with the patient that there may be a patient responsible charge related to this service. The patient expressed understanding and agreed to proceed.    I connected with Emily Madden on 12/05/20  at  10:10 AM EST  EDT by telephone and verified that I am speaking with the correct person using two identifiers.   Consent I discussed the limitations, risks, security and privacy concerns of performing an evaluation and management service by telephone and the availability of in person appointments. I also discussed with the patient that there may be a patient responsible charge related to this service. The patient expressed understanding and agreed to proceed.   Location of Patient: Private  Residence    Location of Provider: [place] [facility] [place] [facility]    Persons participating in Telemedicine visit: Geryl Rankins FNP-BC [place] [id number]   History of Present Illness: Telemedicine visit for: Fatigue  She endorses excessive daytime sleepiness. Onset 3 months ago.  She does exercise and states she gets enough sleep at night. Taking vitamin B and Vitamin D over the counter. She drinks 2 bottles of water per day.  Sentinal symptom the patient feels fatigue began with: none. Patient describes the following psychologic symptoms: none.  Patient denies fever, significant change in weight and exercise intolerance. Symptoms have progressed to a point and plateaued. Severity has been moderate. Previous visits for this problem: none.  Depression screen [facility] 2/9  12/05/2020  Decreased Interest 0  Down, Depressed, Hopeless 0  PHQ - 2 Score 0  Altered sleeping 0  Tired, decreased energy 0  Change in appetite 0  Feeling bad or failure about yourself  0  Trouble concentrating 0  Moving slowly or fidgety/restless 0  Suicidal thoughts 0  PHQ-9 Score 0        Past Medical History:  Diagnosis Date  . Hepatitis   . Kidney stones     Past Surgical History:  Procedure Laterality Date  . KIDNEY STONE SURGERY     left kidney     Family History  Problem Relation Age of Onset  . Hypertension Mother   . Hyperlipidemia Mother   . Breast cancer Mother   . Stroke Maternal Aunt   . Stroke Maternal Aunt     Social History   Socioeconomic History  . Marital status: Single    Spouse name: Not on file  . Number of children: Not on file  . Years of education: Not on file  . Highest education level: Not on file  Occupational History  . Not on file  Tobacco Use  . Smoking status: Never Smoker  . Smokeless tobacco: Never Used  Vaping Use  . Vaping Use: Never used  Substance and Sexual Activity  . Alcohol use: Yes    Comment: social  . Drug use: No  . Sexual activity: Yes    Birth control/protection: None  Other Topics Concern  . Not on file  Social History Narrative  . Not on file   Social Determinants of Health   Financial Resource Strain: Not on file  Food Insecurity: Not on file  Transportation Needs: Not on file  Physical Activity: Not on file  Stress: Not on file  Social Connections: Not on file     Observations/Objective: Awake, alert and oriented x 3   Review of Systems  Constitutional: Positive for malaise/fatigue. Negative for fever and weight loss.  HENT: Negative.  Negative for nosebleeds.   Eyes: Negative.  Negative for blurred vision, double vision and photophobia.  Respiratory: Negative.  Negative for cough and shortness of breath.   Cardiovascular: Negative.  Negative for chest pain, palpitations and leg  swelling.  Gastrointestinal: Negative.  Negative for heartburn, nausea and vomiting.  Musculoskeletal: Negative.  Negative for myalgias.  Neurological: Negative.  Negative for dizziness, focal weakness, seizures and headaches.  Psychiatric/Behavioral: Negative.  Negative for suicidal ideas.    Assessment and Plan: Emily Madden was seen today for fatigue.  Diagnoses and all orders for this visit:  Chronic fatigue -     CBC; Future -     CMP14+EGFR; Future -     Hemoglobin A1c; Future -     TSH; Future  Vitamin D deficiency disease -     VITAMIN D 25 Hydroxy (Vit-D Deficiency, Fractures); Future  Dyslipidemia, goal LDL below 100 -     Lipid panel; Future     Follow Up Instructions Return in about 6 weeks (around 01/16/2021).     I discussed the assessment and treatment plan with the patient. The patient was provided an opportunity to ask questions and all were answered. The patient agreed with the plan and demonstrated an understanding of the instructions.   The patient was advised to call back or seek an in-person evaluation if the symptoms worsen or if the condition fails to improve as anticipated.  I provided 13 minutes of non-face-to-face time during this encounter including median intraservice time, reviewing previous notes, labs, imaging, medications and explaining diagnosis and management.  Gildardo Pounds, FNP-BC

## 2020-12-06 LAB — VITAMIN D 25 HYDROXY (VIT D DEFICIENCY, FRACTURES): Vit D, 25-Hydroxy: 23 ng/mL — ABNORMAL LOW (ref 30.0–100.0)

## 2020-12-06 LAB — LIPID PANEL
Chol/HDL Ratio: 3.5 ratio (ref 0.0–4.4)
Cholesterol, Total: 170 mg/dL (ref 100–199)
HDL: 49 mg/dL (ref >39)
LDL Chol Calc (NIH): 83 mg/dL (ref 0–99)
Triglycerides: 231 mg/dL — ABNORMAL HIGH (ref 0–149)
VLDL Cholesterol Cal: 38 mg/dL (ref 5–40)

## 2020-12-06 LAB — CBC
Hematocrit: 39.6 % (ref 34.0–46.6)
Hemoglobin: 12.9 g/dL (ref 11.1–15.9)
MCH: 27.6 pg (ref 26.6–33.0)
MCHC: 32.6 g/dL (ref 31.5–35.7)
MCV: 85 fL (ref 79–97)
Platelets: 325 10*3/uL (ref 150–450)
RBC: 4.67 x10E6/uL (ref 3.77–5.28)
RDW: 12.5 % (ref 11.7–15.4)
WBC: 8.1 10*3/uL (ref 3.4–10.8)

## 2020-12-06 LAB — CMP14+EGFR
ALT: 18 IU/L (ref 0–32)
AST: 37 IU/L (ref 0–40)
Albumin/Globulin Ratio: 1.5 (ref 1.2–2.2)
Albumin: 4.3 g/dL (ref 3.9–5.0)
Alkaline Phosphatase: 88 IU/L (ref 44–121)
BUN/Creatinine Ratio: 22 (ref 9–23)
BUN: 14 mg/dL (ref 6–20)
Bilirubin Total: 0.3 mg/dL (ref 0.0–1.2)
CO2: 21 mmol/L (ref 20–29)
Calcium: 9.4 mg/dL (ref 8.7–10.2)
Chloride: 104 mmol/L (ref 96–106)
Creatinine, Ser: 0.63 mg/dL (ref 0.57–1.00)
GFR calc Af Amer: 139 mL/min/{1.73_m2} (ref >59)
GFR calc non Af Amer: 121 mL/min/{1.73_m2} (ref >59)
Globulin, Total: 2.8 g/dL (ref 1.5–4.5)
Glucose: 87 mg/dL (ref 65–99)
Potassium: 4.5 mmol/L (ref 3.5–5.2)
Sodium: 141 mmol/L (ref 134–144)
Total Protein: 7.1 g/dL (ref 6.0–8.5)

## 2020-12-06 LAB — TSH: TSH: 1.24 u[IU]/mL (ref 0.450–4.500)

## 2020-12-06 LAB — HEMOGLOBIN A1C
Est. average glucose Bld gHb Est-mCnc: 111 mg/dL
Hgb A1c MFr Bld: 5.5 % (ref 4.8–5.6)

## 2021-01-08 ENCOUNTER — Encounter: Payer: Self-pay | Admitting: Nurse Practitioner

## 2021-01-08 ENCOUNTER — Ambulatory Visit: Payer: Self-pay | Attending: Nurse Practitioner | Admitting: Nurse Practitioner

## 2021-01-08 ENCOUNTER — Other Ambulatory Visit: Payer: Self-pay

## 2021-01-08 VITALS — BP 102/64 | HR 58 | Resp 16 | Wt 151.0 lb

## 2021-01-08 DIAGNOSIS — Z124 Encounter for screening for malignant neoplasm of cervix: Secondary | ICD-10-CM

## 2021-01-08 DIAGNOSIS — E785 Hyperlipidemia, unspecified: Secondary | ICD-10-CM

## 2021-01-08 NOTE — Progress Notes (Signed)
Assessment & Plan:  Diagnoses and all orders for this visit:  Encounter for Papanicolaou smear for cervical cancer screening -     Cytology - PAP -     Cervicovaginal ancillary only  Dyslipidemia, goal LDL below 100 -     Lipid panel; Future    Patient has been counseled on age-appropriate routine health concerns for screening and prevention. These are reviewed and up-to-date. Referrals have been placed accordingly. Immunizations are up-to-date or declined.    Subjective:  No chief complaint on file.  HPI Emily Madden 30 y.o. female presents to office today   Review of Systems  Constitutional: Negative.  Negative for chills, fever, malaise/fatigue and weight loss.  Respiratory: Negative.  Negative for cough, shortness of breath and wheezing.   Cardiovascular: Negative.  Negative for chest pain, orthopnea and leg swelling.  Gastrointestinal: Negative for abdominal pain.  Genitourinary: Negative.  Negative for flank pain.  Skin: Negative.  Negative for rash.  Psychiatric/Behavioral: Negative for suicidal ideas.    Past Medical History:  Diagnosis Date  . Hepatitis   . Kidney stones     Past Surgical History:  Procedure Laterality Date  . KIDNEY STONE SURGERY     left kidney     Family History  Problem Relation Age of Onset  . Hypertension Mother   . Hyperlipidemia Mother   . Breast cancer Mother   . Stroke Maternal Aunt   . Stroke Maternal Aunt     Social History Reviewed with no changes to be made today.   Outpatient Medications Prior to Visit  Medication Sig Dispense Refill  . ibuprofen (ADVIL,MOTRIN) 200 MG tablet Take 200 mg by mouth every 6 (six) hours as needed for moderate pain.     . cephALEXin (KEFLEX) 500 MG capsule Take 1 capsule (500 mg total) by mouth 4 (four) times daily. (Patient not taking: Reported on 12/05/2020) 28 capsule 0  . HYDROcodone-acetaminophen (NORCO/VICODIN) 5-325 MG tablet Take 1 tablet by mouth every 6 (six) hours as needed  for moderate pain. (Patient not taking: Reported on 12/05/2020) 12 tablet 0  . ondansetron (ZOFRAN ODT) 4 MG disintegrating tablet 4mg  ODT q4 hours prn nausea/vomit (Patient not taking: Reported on 12/05/2020) 10 tablet 0   No facility-administered medications prior to visit.    No Known Allergies     Objective:    There were no vitals taken for this visit. Wt Readings from Last 3 Encounters:  06/08/20 139 lb 8.8 oz (63.3 kg)    Physical Exam Exam conducted with a chaperone present.  Constitutional:      Appearance: She is well-developed.  HENT:     Head: Normocephalic.  Cardiovascular:     Rate and Rhythm: Normal rate and regular rhythm.     Heart sounds: Normal heart sounds.  Pulmonary:     Effort: Pulmonary effort is normal.     Breath sounds: Normal breath sounds.  Abdominal:     General: Bowel sounds are normal.     Palpations: Abdomen is soft.     Hernia: There is no hernia in the left inguinal area.  Genitourinary:    Exam position: Lithotomy position.     Labia:        Right: No rash, tenderness, lesion or injury.        Left: No rash, tenderness, lesion or injury.      Vagina: Normal. No signs of injury and foreign body. No vaginal discharge, erythema, tenderness or bleeding.  Cervix: Discharge and erythema present. No cervical motion tenderness, friability, lesion, cervical bleeding or eversion.     Uterus: Normal. Not deviated and not enlarged.      Adnexa: Right adnexa normal and left adnexa normal.       Right: No mass, tenderness or fullness.         Left: No mass, tenderness or fullness.       Rectum: Normal. No external hemorrhoid.  Lymphadenopathy:     Lower Body: No right inguinal adenopathy. No left inguinal adenopathy.  Skin:    General: Skin is warm and dry.  Neurological:     Mental Status: She is alert and oriented to person, place, and time.  Psychiatric:        Behavior: Behavior normal.        Thought Content: Thought content normal.         Judgment: Judgment normal.          Patient has been counseled extensively about nutrition and exercise as well as the importance of adherence with medications and regular follow-up. The patient was given clear instructions to go to ER or return to medical center if symptoms don't improve, worsen or new problems develop. The patient verbalized understanding.   Follow-up: Return for LABS ONLY 3 month fasting lipid panel.   Claiborne Rigg, FNP-BC [facility] [place], [place] [phone]   01/08/2021, 3:03 PM

## 2021-01-09 LAB — CERVICOVAGINAL ANCILLARY ONLY
Bacterial Vaginitis (gardnerella): NEGATIVE
Candida Glabrata: NEGATIVE
Candida Vaginitis: NEGATIVE
Chlamydia: NEGATIVE
Comment: NEGATIVE
Comment: NEGATIVE
Comment: NEGATIVE
Comment: NEGATIVE
Comment: NEGATIVE
Comment: NORMAL
Neisseria Gonorrhea: NEGATIVE
Trichomonas: NEGATIVE

## 2021-01-11 LAB — CYTOLOGY - PAP
Comment: NEGATIVE
Diagnosis: NEGATIVE
High risk HPV: NEGATIVE

## 2021-04-02 ENCOUNTER — Other Ambulatory Visit: Payer: Self-pay

## 2021-04-06 ENCOUNTER — Encounter (INDEPENDENT_AMBULATORY_CARE_PROVIDER_SITE_OTHER): Payer: Self-pay

## 2021-04-06 ENCOUNTER — Other Ambulatory Visit: Payer: Self-pay | Admitting: Nurse Practitioner

## 2021-04-06 ENCOUNTER — Ambulatory Visit: Payer: Self-pay | Attending: Nurse Practitioner

## 2021-04-06 ENCOUNTER — Other Ambulatory Visit: Payer: Self-pay

## 2021-04-06 DIAGNOSIS — Z1159 Encounter for screening for other viral diseases: Secondary | ICD-10-CM

## 2021-04-06 DIAGNOSIS — E785 Hyperlipidemia, unspecified: Secondary | ICD-10-CM

## 2021-04-06 DIAGNOSIS — Z114 Encounter for screening for human immunodeficiency virus [HIV]: Secondary | ICD-10-CM

## 2021-04-07 LAB — LIPID PANEL
Chol/HDL Ratio: 3.3 ratio (ref 0.0–4.4)
Cholesterol, Total: 154 mg/dL (ref 100–199)
HDL: 47 mg/dL (ref >39)
LDL Chol Calc (NIH): 85 mg/dL (ref 0–99)
Triglycerides: 124 mg/dL (ref 0–149)
VLDL Cholesterol Cal: 22 mg/dL (ref 5–40)

## 2021-04-07 LAB — HCV INTERPRETATION

## 2021-04-07 LAB — HCV AB W REFLEX TO QUANT PCR: HCV Ab: <0.1 s/co ratio (ref 0.0–0.9)

## 2021-04-07 LAB — HIV ANTIBODY (ROUTINE TESTING W REFLEX): HIV Screen 4th Generation wRfx: NONREACTIVE

## 2021-08-09 ENCOUNTER — Encounter (HOSPITAL_COMMUNITY): Payer: Self-pay | Admitting: Emergency Medicine

## 2021-08-09 ENCOUNTER — Other Ambulatory Visit: Payer: Self-pay

## 2021-08-09 ENCOUNTER — Ambulatory Visit (HOSPITAL_COMMUNITY)
Admission: EM | Admit: 2021-08-09 | Discharge: 2021-08-09 | Disposition: A | Discharge status: Home / Self Care | Payer: Self-pay | Attending: Emergency Medicine | Admitting: Emergency Medicine

## 2021-08-09 DIAGNOSIS — R42 Dizziness and giddiness: Secondary | ICD-10-CM

## 2021-08-09 MED ORDER — MECLIZINE HCL 25 MG PO TABS
25.0000 mg | ORAL_TABLET | Freq: Three times a day (TID) | ORAL | 0 refills | Status: DC | PRN
Start: 2021-08-09 — End: 2021-10-16
  Filled 2021-08-09: qty 30, 10d supply, fill #0

## 2021-08-09 NOTE — Discharge Instructions (Addendum)
Push electrolyte containing fluids such as Pedialyte and Gatorade over the next several days.  Slow positional changes-do not turn your head quickly or go from lying to sitting to standing quickly.  Try the meclizine.

## 2021-08-09 NOTE — ED Provider Notes (Signed)
HPI  SUBJECTIVE:  Emily Madden is a 30 y.o. female who presents with intermittent episodes of dizziness described as feeling "off balance" as if "I am about to fall to the right" starting yesterday.  She reports accompanying nausea.  It lasted 2 to 3 minutes and then resolves.  She reports an intermittent "beeping sound" that does not always accompany the dizziness.  No chest pain, palpitations, ear pain, fullness, arm or leg weakness, visual changes, slurred speech, aphasia, vomiting.  She had a mild midline headache along her forehead yesterday, but it resolved as of this morning.  No change in her hearing.  She has had 2 weeks of nasal congestion and rhinorrhea.  She has never had symptoms like this before.  Symptoms are better with staying still, worse when she turns her head.  She states that she drinks 2 L of water a day.  She has a past medical history of nephrolithiasis, hepatitis, vertigo.  No history of atrial fibrillation, stroke, aneurysm.  LMP: 9/28.  Denies the possibility of being pregnant.  PMD: Claiborne Rigg, NP  All history obtained through the language line.  Past Medical History:  Diagnosis Date   Hepatitis    Kidney stones     Past Surgical History:  Procedure Laterality Date   KIDNEY STONE SURGERY     left kidney     Family History  Problem Relation Age of Onset   Hypertension Mother    Hyperlipidemia Mother    Breast cancer Mother    Stroke Maternal Aunt    Stroke Maternal Aunt     Social History   Tobacco Use   Smoking status: Never   Smokeless tobacco: Never  Vaping Use   Vaping Use: Never used  Substance Use Topics   Alcohol use: Yes    Comment: social   Drug use: No    No current facility-administered medications for this encounter.  Current Outpatient Medications:    meclizine (ANTIVERT) 25 MG tablet, Take 1 tablet (25 mg total) by mouth 3 (three) times daily as needed for dizziness., Disp: 30 tablet, Rfl: 0  No Known  Allergies   ROS  As noted in HPI.   Physical Exam  BP 105/65   Pulse (!) 59   Temp 98.1 F (36.7 C)   Resp 17   SpO2 99%   Constitutional: Well developed, well nourished, no acute distress Eyes:  EOMI, conjunctiva normal bilaterally PERRLA.  No nystagmus HENT: Normocephalic, atraumatic,mucus membranes moist.  TMs normal bilaterally.  Hearing grossly intact and equal bilaterally.  Mild nasal congestion. Respiratory: Normal inspiratory effort Cardiovascular: Normal rate, regular rhythm, no murmurs, rubs, gallops.  No carotid bruit. GI: nondistended skin: No rash, skin intact Musculoskeletal: no deformities Neurologic: Alert & oriented x 3, cranial nerves III through XII intact.  Finger-nose, heel shin within normal limits.  Tandem gait steady.  Romberg negative.  Dix-Hallpike negative.  Symptoms provoked with going from lying to standing rapidly. Psychiatric: Speech and behavior appropriate   ED Course   Medications - No data to display  No orders of the defined types were placed in this encounter.   No results found for this or any previous visit (from the past 24 hour(s)). No results found.  ED Clinical Impression  1. Dizziness      ED Assessment/Plan  Presentation consistent with a peripheral cause of her dizziness.  Does not appear to be an emergent cause, such as a cerebellar stroke.  We will have her push  electrolyte containing fluids over the next few days, slow positional changes, send home with meclizine.  Follow-up with her PMD in several days if not getting any better.  Strict ER return precautions given.  Work note for today and tomorrow.  Spent 45 minutes obtaining H&P, explaining medical decision making, treatment plan and plan for follow-up with patient using the language line.  Using the language line, discussed  MDM, treatment plan, and plan for follow-up with patient. Discussed sn/sx that should prompt return to the ED. patient agrees with plan.    Meds ordered this encounter  Medications   meclizine (ANTIVERT) 25 MG tablet    Sig: Take 1 tablet (25 mg total) by mouth 3 (three) times daily as needed for dizziness.    Dispense:  30 tablet    Refill:  0      *This clinic note was created using Scientist, clinical (histocompatibility and immunogenetics). Therefore, there may be occasional mistakes despite careful proofreading.  ?    Domenick Gong, MD 08/09/21 2144

## 2021-08-09 NOTE — ED Triage Notes (Addendum)
Pt is present toady with dizziness and nausea. Pt sx started yesterday

## 2021-08-10 ENCOUNTER — Ambulatory Visit: Payer: Self-pay | Admitting: Nurse Practitioner

## 2021-08-10 ENCOUNTER — Other Ambulatory Visit: Payer: Self-pay

## 2021-08-29 ENCOUNTER — Ambulatory Visit: Payer: Self-pay | Admitting: Nurse Practitioner

## 2021-08-30 ENCOUNTER — Ambulatory Visit: Payer: Self-pay | Admitting: Physician Assistant

## 2021-10-16 ENCOUNTER — Other Ambulatory Visit: Payer: Self-pay

## 2021-10-16 ENCOUNTER — Inpatient Hospital Stay: Payer: Self-pay | Admitting: Family Medicine

## 2021-10-16 ENCOUNTER — Encounter: Payer: Self-pay | Admitting: Nurse Practitioner

## 2021-10-16 ENCOUNTER — Ambulatory Visit (INDEPENDENT_AMBULATORY_CARE_PROVIDER_SITE_OTHER): Payer: Self-pay | Admitting: Nurse Practitioner

## 2021-10-16 DIAGNOSIS — R42 Dizziness and giddiness: Secondary | ICD-10-CM

## 2021-10-16 MED ORDER — MECLIZINE HCL 25 MG PO TABS
25.0000 mg | ORAL_TABLET | Freq: Three times a day (TID) | ORAL | 0 refills | Status: DC | PRN
  Filled 2021-10-16: qty 30, 10d supply, fill #0

## 2021-10-16 MED ORDER — TIZANIDINE HCL 4 MG PO TABS
4.0000 mg | ORAL_TABLET | Freq: Four times a day (QID) | ORAL | 0 refills | Status: DC | PRN
Start: 2021-10-16 — End: 2022-03-19
  Filled 2021-10-16: qty 30, 8d supply, fill #0

## 2021-10-16 NOTE — Progress Notes (Signed)
Virtual Visit via Telephone Note  I connected with Emily Madden on 10/16/21 at 10:00 AM EST by telephone and verified that I am speaking with the correct person using two identifiers.  Location: Patient: home Provider: office   I discussed the limitations, risks, security and privacy concerns of performing an evaluation and management service by telephone and the availability of in person appointments. I also discussed with the patient that there may be a patient responsible charge related to this service. The patient expressed understanding and agreed to proceed.   History of Present Illness:  Patient presents today with vertigo through telephone visit.  Spanish interpreter was used for this visit.  Patient was seen in the ED in October for the same issue and was prescribed meclizine.  Patient states that this did help but over the past few weeks her dizziness and mild headaches have returned.  We discussed that we can refill her meclizine and place a referral for physical therapy for vestibular rehabilitation. Denies f/c/s, n/v/d, hemoptysis, PND, chest pain or edema.      Observations/Objective:  Vitals with BMI 08/09/2021 01/08/2021 06/08/2020  Height - - -  Weight - 151 lbs -  BMI - - -  Systolic 105 102 99  Diastolic 65 64 67  Pulse 59 58 61      Assessment and Plan:  Vertigo:  Will refill meclizine  Stay active  Please get up and down slowly  Will place referral to PT for vestibular rehabilitation   May try OTC magnesium for headaches  Will order Tizanidine for headaches   Stay well hydrated   Follow up:  Follow up with PCP if symptoms do not improve     I discussed the assessment and treatment plan with the patient. The patient was provided an opportunity to ask questions and all were answered. The patient agreed with the plan and demonstrated an understanding of the instructions.   The patient was advised to call back or seek an in-person  evaluation if the symptoms worsen or if the condition fails to improve as anticipated.  I provided 23 minutes of non-face-to-face time during this encounter.   Ivonne Andrew, NP

## 2021-10-16 NOTE — Patient Instructions (Signed)
Vertigo:  Will refill meclizine  Stay active  Please get up and down slowly  Will place referral to PT for vestibular rehabilitation   May try OTC magnesium for headaches  Will order Tizanidine for headaches   Stay well hydrated   Follow up:  Follow up with PCP if symptoms do not improve

## 2021-10-26 ENCOUNTER — Other Ambulatory Visit: Payer: Self-pay

## 2021-10-30 ENCOUNTER — Ambulatory Visit: Payer: Self-pay | Attending: Nurse Practitioner | Admitting: Physical Therapy

## 2021-11-28 ENCOUNTER — Encounter: Payer: Self-pay | Admitting: Nurse Practitioner

## 2021-11-28 ENCOUNTER — Other Ambulatory Visit: Payer: Self-pay

## 2021-11-28 ENCOUNTER — Ambulatory Visit: Payer: Self-pay | Attending: Family Medicine | Admitting: Nurse Practitioner

## 2021-11-28 VITALS — BP 105/64 | HR 74 | Ht 65.0 in | Wt 150.1 lb

## 2021-11-28 DIAGNOSIS — I722 Aneurysm of renal artery: Secondary | ICD-10-CM | POA: Insufficient documentation

## 2021-11-28 DIAGNOSIS — Z0000 Encounter for general adult medical examination without abnormal findings: Secondary | ICD-10-CM

## 2021-11-28 DIAGNOSIS — H90A22 Sensorineural hearing loss, unilateral, left ear, with restricted hearing on the contralateral side: Secondary | ICD-10-CM

## 2021-11-28 NOTE — Patient Instructions (Addendum)
USERNAME  [id number]  Placed in [facility]  [address] [place], [place] [address] Ph# [place] [phone]     Moisturizers Cerave Nivea Vaseline Brand

## 2021-11-28 NOTE — Progress Notes (Signed)
Assessment & Plan:  [name] was seen today for headache, dizziness and annual exam.  Diagnoses and all orders for this visit:  Annual physical exam -     CMP14+EGFR -     CBC -     Lipid panel  Aneurysm of left renal artery  Resolved.  Patient has been counseled on age-appropriate routine health concerns for screening and prevention. These are reviewed and up-to-date. Referrals have been placed accordingly. Immunizations are up-to-date or declined.    Subjective:   Chief Complaint  Patient presents with   Headache   Dizziness   Annual Exam   HPI Emily Madden 31 y.o. female presents to office today for annual physical.  She has a past medical history of Hepatitis and Kidney stones.   She has a history of vertigo with headaches. Was referred to vestibular rehab but was a no show for her appt on 10-30-2021. She states her appt that day interfered with her work schedule but she does plan to reschedule soon.   She endorses left eye blurriness which occurs each time she opens and closes the left eye. She denies any eye pain or other symptoms of red eye.   SKIN She has bilateral itching of both breasts. On exam today there appears to be a red scaly rash on the outer borders of both nipples. There is no "weeping" of the area noted. DDX. Paget's vs Atopic Dermatitis. Will refer to Breast clinic at this time.     Review of Systems  Constitutional:  Negative for fever, malaise/fatigue and weight loss.  HENT: Negative.  Negative for nosebleeds.   Eyes:  Positive for blurred vision (left eye). Negative for double vision and photophobia.  Respiratory: Negative.  Negative for cough and shortness of breath.   Cardiovascular: Negative.  Negative for chest pain, palpitations and leg swelling.  Gastrointestinal: Negative.  Negative for heartburn, nausea and vomiting.  Genitourinary: Negative.   Musculoskeletal: Negative.  Negative for myalgias.  Skin:  Positive for itching (bilateral  nipples).  Neurological: Negative.  Negative for dizziness, focal weakness, seizures and headaches.  Endo/Heme/Allergies: Negative.   Psychiatric/Behavioral: Negative.  Negative for suicidal ideas.    Past Medical History:  Diagnosis Date   Hepatitis    Kidney stones     Past Surgical History:  Procedure Laterality Date   KIDNEY STONE SURGERY     left kidney     Family History  Problem Relation Age of Onset   Cancer Mother    Hypertension Mother    Hyperlipidemia Mother    Breast cancer Mother    Stroke Maternal Aunt    Stroke Maternal Aunt    Breast cancer Maternal Grandmother    Cancer Maternal Grandfather     Social History Reviewed with no changes to be made today.   Outpatient Medications Prior to Visit  Medication Sig Dispense Refill   meclizine (ANTIVERT) 25 MG tablet Take 1 tablet (25 mg total) by mouth 3 (three) times daily as needed for dizziness. 30 tablet 0   tiZANidine (ZANAFLEX) 4 MG tablet Take 1 tablet (4 mg total) by mouth every 6 (six) hours as needed for muscle spasms. 30 tablet 0   No facility-administered medications prior to visit.    No Known Allergies     Objective:    BP 105/64    Pulse 74    Ht _0  (1.651 m)    Wt 150 lb 2 oz (68.1 kg)    LMP 11/06/2021  SpO2 99%    BMI 24.98 kg/m  Wt Readings from Last 3 Encounters:  11/28/21 150 lb 2 oz (68.1 kg)  01/08/21 151 lb (68.5 kg)  06/08/20 139 lb 8.8 oz (63.3 kg)    Physical Exam Vitals and nursing note reviewed.  Constitutional:      Appearance: She is well-developed.  HENT:     Head: Normocephalic and atraumatic.     Right Ear: Hearing, tympanic membrane, ear canal and external ear normal.     Left Ear: Hearing, tympanic membrane, ear canal and external ear normal.     Nose: Nose normal.     Right Turbinates: Not enlarged.     Left Turbinates: Not enlarged.     Mouth/Throat:     Lips: Pink.     Mouth: Mucous membranes are moist.     Dentition: No dental tenderness, gingival  swelling, dental abscesses or gum lesions.     Pharynx: No oropharyngeal exudate.  Eyes:     General: No scleral icterus.       Right eye: No discharge.     Extraocular Movements: Extraocular movements intact.     Conjunctiva/sclera: Conjunctivae normal.     Pupils: Pupils are equal, round, and reactive to light.  Neck:     Thyroid: No thyromegaly.     Trachea: No tracheal deviation.  Cardiovascular:     Rate and Rhythm: Normal rate and regular rhythm.     Heart sounds: Normal heart sounds. No murmur heard.   No friction rub. No gallop.  Pulmonary:     Effort: Pulmonary effort is normal. No tachypnea, accessory muscle usage or respiratory distress.     Breath sounds: Normal breath sounds. No decreased breath sounds, wheezing, rhonchi or rales.  Chest:     Chest wall: No tenderness.  Breasts:    Right: Skin change (SEE HPI) present.     Left: Skin change (SEE HPI) present.    Abdominal:     General: Bowel sounds are normal. There is no distension.     Palpations: Abdomen is soft. There is no mass.     Tenderness: There is no abdominal tenderness. There is no right CVA tenderness, left CVA tenderness, guarding or rebound.     Hernia: No hernia is present.  Musculoskeletal:        General: No tenderness or deformity. Normal range of motion.     Cervical back: Normal range of motion and neck supple.  Lymphadenopathy:     Cervical: No cervical adenopathy.  Skin:    General: Skin is warm and dry.     Findings: No erythema.  Neurological:     Mental Status: She is alert and oriented to person, place, and time.     Cranial Nerves: No cranial nerve deficit.     Motor: Motor function is intact.     Coordination: Coordination is intact. Coordination normal.     Gait: Gait is intact.     Deep Tendon Reflexes:     Reflex Scores:      Patellar reflexes are 1+ on the right side and 1+ on the left side. Psychiatric:        Attention and Perception: Attention normal.        Mood and  Affect: Mood normal.        Speech: Speech normal.        Behavior: Behavior normal. Behavior is cooperative.        Thought Content: Thought content normal.  Judgment: Judgment normal.         Patient has been counseled extensively about nutrition and exercise as well as the importance of adherence with medications and regular follow-up. The patient was given clear instructions to go to ER or return to medical center if symptoms don't improve, worsen or new problems develop. The patient verbalized understanding.   Follow-up: Return if symptoms worsen or fail to improve.   Gildardo Pounds, FNP-BC [facility] [place], [place]   11/28/2021, 11:09 AM

## 2021-11-29 ENCOUNTER — Other Ambulatory Visit: Payer: Self-pay

## 2021-11-29 DIAGNOSIS — R234 Changes in skin texture: Secondary | ICD-10-CM

## 2021-11-29 LAB — CMP14+EGFR
ALT: 9 IU/L (ref 0–32)
AST: 12 IU/L (ref 0–40)
Albumin/Globulin Ratio: 1.7 (ref 1.2–2.2)
Albumin: 4.6 g/dL (ref 3.8–4.8)
Alkaline Phosphatase: 95 IU/L (ref 44–121)
BUN/Creatinine Ratio: 15 (ref 9–23)
BUN: 13 mg/dL (ref 6–20)
Bilirubin Total: 0.6 mg/dL (ref 0.0–1.2)
CO2: 21 mmol/L (ref 20–29)
Calcium: 9.2 mg/dL (ref 8.7–10.2)
Chloride: 103 mmol/L (ref 96–106)
Creatinine, Ser: 0.85 mg/dL (ref 0.57–1.00)
Globulin, Total: 2.7 g/dL (ref 1.5–4.5)
Glucose: 79 mg/dL (ref 70–99)
Potassium: 4.4 mmol/L (ref 3.5–5.2)
Sodium: 137 mmol/L (ref 134–144)
Total Protein: 7.3 g/dL (ref 6.0–8.5)
eGFR: 94 mL/min/{1.73_m2} (ref >59)

## 2021-11-29 LAB — LIPID PANEL
Chol/HDL Ratio: 3.2 ratio (ref 0.0–4.4)
Cholesterol, Total: 163 mg/dL (ref 100–199)
HDL: 51 mg/dL (ref >39)
LDL Chol Calc (NIH): 92 mg/dL (ref 0–99)
Triglycerides: 108 mg/dL (ref 0–149)
VLDL Cholesterol Cal: 20 mg/dL (ref 5–40)

## 2021-11-29 LAB — CBC
Hematocrit: 39.4 % (ref 34.0–46.6)
Hemoglobin: 13.1 g/dL (ref 11.1–15.9)
MCH: 27.5 pg (ref 26.6–33.0)
MCHC: 33.2 g/dL (ref 31.5–35.7)
MCV: 83 fL (ref 79–97)
Platelets: 307 10*3/uL (ref 150–450)
RBC: 4.76 x10E6/uL (ref 3.77–5.28)
RDW: 12.2 % (ref 11.7–15.4)
WBC: 6.6 10*3/uL (ref 3.4–10.8)

## 2021-11-30 ENCOUNTER — Telehealth: Payer: Self-pay

## 2021-11-30 NOTE — Telephone Encounter (Signed)
Call pt and no vm set up to leave a vm

## 2021-11-30 NOTE — Telephone Encounter (Signed)
-----   Message from Claiborne Rigg, NP sent at 11/30/2021  8:04 AM EST ----- Cholesterol levels are normal. Kidney, liver function and electrolytes are normal.   Glucose for diabetes or prediabetes is normal CBC does not indicate any anemia or bleeding disorders

## 2021-12-27 ENCOUNTER — Ambulatory Visit
Admission: RE | Admit: 2021-12-27 | Discharge: 2021-12-27 | Disposition: A | Discharge status: Home / Self Care | Payer: No Typology Code available for payment source | Source: Ambulatory Visit

## 2021-12-27 ENCOUNTER — Ambulatory Visit
Admission: RE | Admit: 2021-12-27 | Discharge: 2021-12-27 | Disposition: A | Discharge status: Home / Self Care | Payer: Self-pay | Source: Ambulatory Visit | Attending: Obstetrics and Gynecology | Admitting: Obstetrics and Gynecology

## 2021-12-27 ENCOUNTER — Other Ambulatory Visit: Payer: Self-pay

## 2021-12-27 ENCOUNTER — Ambulatory Visit: Payer: Self-pay | Admitting: *Deleted

## 2021-12-27 ENCOUNTER — Ambulatory Visit
Admission: RE | Admit: 2021-12-27 | Discharge: 2021-12-27 | Disposition: A | Discharge status: Home / Self Care | Payer: No Typology Code available for payment source | Source: Ambulatory Visit | Attending: Obstetrics and Gynecology | Admitting: Obstetrics and Gynecology

## 2021-12-27 ENCOUNTER — Ambulatory Visit: Admission: RE | Admit: 2021-12-27 | Payer: No Typology Code available for payment source | Source: Ambulatory Visit

## 2021-12-27 VITALS — BP 104/64 | Wt 148.6 lb

## 2021-12-27 DIAGNOSIS — Z1239 Encounter for other screening for malignant neoplasm of breast: Secondary | ICD-10-CM

## 2021-12-27 DIAGNOSIS — N6325 Unspecified lump in the left breast, overlapping quadrants: Secondary | ICD-10-CM

## 2021-12-27 DIAGNOSIS — R234 Changes in skin texture: Secondary | ICD-10-CM

## 2021-12-27 NOTE — Progress Notes (Signed)
[name] Emily Madden is a 31 y.o. female who presents to [facility] clinic today with complaint of bilateral scaly rash that is itchy bilateral nipples x 2 months.  ?  ?Pap Smear: Pap smear not completed today. Last Pap smear was 01/08/2021 at [facility] and was normal with negative HPV. Per patient has no history of an abnormal Pap smear. Last Pap smear result is available in Epic. ?  ?Physical exam: ?Breasts ?Breasts symmetrical. Bilateral nipples dry and scaly appearing with the right greater than the left. No nipple retraction bilateral breasts. No nipple discharge bilateral breasts. No lymphadenopathy. No lumps palpated right breast. Palpated a pea sized lump within the left breast at 3 o'clock 4 cm from the nipple. No complaints of pain or tenderness on exam. ? ?Pelvic/Bimanual ?Pap is not indicated today per BCCCP guidelines. ?  ?Smoking History: ?Patient has never smoked. ?  ?Patient Navigation: ?Patient education provided. Access to services provided for patient through [place] program. Spanish interpreter Alene Mires from [facility] provided.  ?  ?Breast and Cervical Cancer Risk Assessment: ?Patient has family history of her mother and a maternal aunt having breast cancer. Patient has no known genetic mutations or history of radiation treatment to the chest before age 83. Patient does not have history of cervical dysplasia, immunocompromised, or DES exposure in-utero. Breast cancer risk assessment completed. No breast cancer risk calculated due to patient is less than 83 years old. ? ?Risk Assessment   ? ? Risk Scores   ? ?   12/27/2021  ? Last edited by: Narda Rutherford, LPN  ? 5-year risk:   ? Lifetime risk:   ? ?  ?  ? ?  ? ? ?A: ?BCCCP exam without pap smear ?Complaint of bilateral scaly and dry nipples. ? ?P: ?Referred patient to the [facility] for a diagnostic mammogram. Appointment scheduled Thursday, December 27, 2021 at 1400. ? ?Priscille Heidelberg,  RN ?12/27/2021 12:43 PM   ?

## 2021-12-27 NOTE — Patient Instructions (Signed)
Explained breast self awareness with [name]. Patient did not need a Pap smear today due to last Pap smear and HPV typing was 01/08/2021. Let her know BCCCP will cover Pap smears and HPV typing every 5 years unless has a history of abnormal Pap smears. Referred patient to the [place] for a diagnostic mammogram. Appointment scheduled Thursday, December 27, 2021 at 1400. Patient aware of appointment and will be there. [name] verbalized understanding. ? ?Emily Madden, Arvil Chaco, RN ?12:43 PM ? ? ? ? ?

## 2022-03-19 ENCOUNTER — Ambulatory Visit (INDEPENDENT_AMBULATORY_CARE_PROVIDER_SITE_OTHER): Payer: No Typology Code available for payment source

## 2022-03-19 ENCOUNTER — Encounter (HOSPITAL_COMMUNITY): Payer: Self-pay

## 2022-03-19 ENCOUNTER — Ambulatory Visit (HOSPITAL_COMMUNITY)
Admission: EM | Admit: 2022-03-19 | Discharge: 2022-03-19 | Disposition: A | Discharge status: Home / Self Care | Payer: No Typology Code available for payment source | Attending: Family Medicine | Admitting: Family Medicine

## 2022-03-19 ENCOUNTER — Other Ambulatory Visit: Payer: Self-pay

## 2022-03-19 DIAGNOSIS — T07XXXA Unspecified multiple injuries, initial encounter: Secondary | ICD-10-CM

## 2022-03-19 DIAGNOSIS — M25561 Pain in right knee: Secondary | ICD-10-CM

## 2022-03-19 DIAGNOSIS — M25531 Pain in right wrist: Secondary | ICD-10-CM

## 2022-03-19 MED ORDER — IBUPROFEN 800 MG PO TABS
800.0000 mg | ORAL_TABLET | Freq: Three times a day (TID) | ORAL | 0 refills | Status: DC | PRN
Start: 2022-03-19 — End: 2022-10-10
  Filled 2022-03-19: qty 21, 7d supply, fill #0

## 2022-03-19 MED ORDER — KETOROLAC TROMETHAMINE 30 MG/ML IJ SOLN
INTRAMUSCULAR | Status: AC
  Filled 2022-03-19: qty 1

## 2022-03-19 MED ORDER — KETOROLAC TROMETHAMINE 30 MG/ML IJ SOLN
30.0000 mg | Freq: Once | INTRAMUSCULAR | Status: AC
  Administered 2022-03-19: 30 mg via INTRAMUSCULAR

## 2022-03-19 MED ORDER — MUPIROCIN 2 % EX OINT
1.0000 "application " | TOPICAL_OINTMENT | Freq: Two times a day (BID) | CUTANEOUS | 0 refills | Status: DC
  Filled 2022-03-19: qty 22, 11d supply, fill #0

## 2022-03-19 NOTE — Discharge Instructions (Addendum)
Your x-rays did not show any broken bones of the wrist or knee(no habian huesos rotos en las radiografias)  Ice and elevate what ever is hurting and swollen. (Ponga hielo a las areas doloridas; Corporate investment banker)  Take ibuprofen 800 mg--1 tab every 8 hours as needed for pain. (1 cada 8 horas si tiene dolor)  Mupirocin antibiotic ointment to the abrasions on your shoulder face and knee and arm .(Ponga el unguento a las heridas a Careers adviser)  you have been given a shot of Toradol 30 mg today. (Le hemos dado una inyeccion de toradol para dolor hoy)
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
## 2022-10-10 ENCOUNTER — Emergency Department (HOSPITAL_COMMUNITY): Payer: No Typology Code available for payment source

## 2022-10-10 ENCOUNTER — Emergency Department (HOSPITAL_COMMUNITY)
Admission: EM | Admit: 2022-10-10 | Discharge: 2022-10-10 | Disposition: A | Discharge status: Home / Self Care | Payer: No Typology Code available for payment source | Attending: Emergency Medicine | Admitting: Emergency Medicine

## 2022-10-10 ENCOUNTER — Encounter (HOSPITAL_COMMUNITY): Payer: Self-pay

## 2022-10-10 DIAGNOSIS — Z20822 Contact with and (suspected) exposure to covid-19: Secondary | ICD-10-CM | POA: Insufficient documentation

## 2022-10-10 DIAGNOSIS — B349 Viral infection, unspecified: Secondary | ICD-10-CM | POA: Insufficient documentation

## 2022-10-10 DIAGNOSIS — R0789 Other chest pain: Secondary | ICD-10-CM | POA: Insufficient documentation

## 2022-10-10 DIAGNOSIS — R202 Paresthesia of skin: Secondary | ICD-10-CM | POA: Insufficient documentation

## 2022-10-10 DIAGNOSIS — R42 Dizziness and giddiness: Secondary | ICD-10-CM | POA: Insufficient documentation

## 2022-10-10 LAB — RESP PANEL BY RT-PCR (RSV, FLU A&B, COVID)  RVPGX2
Influenza A by PCR: NEGATIVE
Influenza B by PCR: NEGATIVE
Resp Syncytial Virus by PCR: NEGATIVE
SARS Coronavirus 2 by RT PCR: NEGATIVE

## 2022-10-10 LAB — BASIC METABOLIC PANEL
Anion gap: 6 (ref 5–15)
BUN: 10 mg/dL (ref 6–20)
CO2: 24 mmol/L (ref 22–32)
Calcium: 8.8 mg/dL — ABNORMAL LOW (ref 8.9–10.3)
Chloride: 108 mmol/L (ref 98–111)
Creatinine, Ser: 0.67 mg/dL (ref 0.44–1.00)
GFR, Estimated: >60 mL/min (ref >60)
Glucose, Bld: 95 mg/dL (ref 70–99)
Potassium: 3.9 mmol/L (ref 3.5–5.1)
Sodium: 138 mmol/L (ref 135–145)

## 2022-10-10 LAB — CBC
HCT: 41.5 % (ref 36.0–46.0)
Hemoglobin: 13 g/dL (ref 12.0–15.0)
MCH: 27.4 pg (ref 26.0–34.0)
MCHC: 31.3 g/dL (ref 30.0–36.0)
MCV: 87.6 fL (ref 80.0–100.0)
Platelets: 296 10*3/uL (ref 150–400)
RBC: 4.74 MIL/uL (ref 3.87–5.11)
RDW: 13.2 % (ref 11.5–15.5)
WBC: 7.1 10*3/uL (ref 4.0–10.5)
nRBC: 0 % (ref 0.0–0.2)

## 2022-10-10 LAB — GROUP A STREP BY PCR: Group A Strep by PCR: NOT DETECTED

## 2022-10-10 LAB — TROPONIN I (HIGH SENSITIVITY)
Troponin I (High Sensitivity): <2 ng/L (ref <18)
Troponin I (High Sensitivity): <2 ng/L (ref <18)

## 2022-10-10 LAB — I-STAT BETA HCG BLOOD, ED (MC, WL, AP ONLY): I-stat hCG, quantitative: <5 m[IU]/mL (ref <5)

## 2022-10-10 MED ORDER — IBUPROFEN 600 MG PO TABS
600.0000 mg | ORAL_TABLET | Freq: Four times a day (QID) | ORAL | 0 refills | Status: DC | PRN
  Filled 2022-10-10: qty 30, 8d supply, fill #0

## 2022-10-10 NOTE — ED Triage Notes (Signed)
Pt arrived via POV, c/o sternal chest pain while awake. Sore throat, cough, fevers.

## 2022-10-10 NOTE — ED Provider Triage Note (Signed)
Emergency Medicine Provider Triage Evaluation Note  Emily Madden , a 31 y.o. female  was evaluated in triage.  Pt complains of stabbing chest pain onset yesterday. No pain while laying down. History of similar symptoms several years ago. Has nasal congestion, sore throat, cough. Her sore throat has been going on for 3 days. Tried tylenol and advil at home with no relief of her symptoms. No history of MI, HTN, DM, cardiac cath, stents. Has a PCP.  Review of Systems  Positive:  Negative:   Physical Exam  BP 133/75 (BP Location: Left Arm)   Pulse 72   Temp 98.9 F (37.2 C) (Oral)   Resp 16   SpO2 100%  Gen:   Awake, no distress   Resp:  Normal effort  MSK:   Moves extremities without difficulty  Other:  Right sided chest wall TTP. Uvula midline without swelling. Able to speak in clear complete sentences. Tolerating secretions well.   Medical Decision Making  Medically screening exam initiated at 11:21 AM.  Appropriate orders placed.  Salvadore Dom [name] was informed that the remainder of the evaluation will be completed by another provider, this initial triage assessment does not replace that evaluation, and the importance of remaining in the ED until their evaluation is complete.     Rashawnda Gaba A, PA-C 10/10/22 1126

## 2022-10-10 NOTE — ED Provider Notes (Signed)
[facility]-EMERGENCY DEPT Provider Note   CSN: [id number] Arrival date & time: 10/10/22  1059     History  Chief Complaint  Patient presents with   Chest Pain   Sore Throat    Emily Madden Emily Madden is a 31 y.o. female.  The history is provided by the patient and medical records. The history is limited by a language barrier. A language interpreter was used.  Chest Pain Sore Throat Associated symptoms include chest pain.     31 year old female significant history of kidney stones, hepatitis, presenting with cold symptoms.  Patient is Spanish-speaking, history obtained using language interpreter.  Patient report for the past 3 days she has had cold symptoms which include throat irritation, occasional cough, chest discomfort, feeling dizzy, and tingling sensation in her hands and congestion.  She tries Tylenol, ibuprofen, drinking tea and honey at home with some relief.  She denies any recent sick contact.  She denies any significant shortness of breath abdominal pain or dysuria.  Home Medications Prior to Admission medications   Medication Sig Start Date End Date Taking? Authorizing Provider  ibuprofen (ADVIL) 800 MG tablet Take 1 tablet (800 mg total) by mouth every 8 (eight) hours as needed (pain). 03/19/22   Zenia Resides, MD  mupirocin ointment (BACTROBAN) 2 % Apply 1 application. topically 2 (two) times daily. To affected area till better 03/19/22   Zenia Resides, MD      Allergies    Patient has no known allergies.    Review of Systems   Review of Systems  Cardiovascular:  Positive for chest pain.  All other systems reviewed and are negative.   Physical Exam Updated Vital Signs BP 109/74 (BP Location: Right Arm)   Pulse 82   Temp 98 F (36.7 C) (Oral)   Resp 16   SpO2 100%  Physical Exam Vitals and nursing note reviewed.  Constitutional:      General: She is not in acute distress.    Appearance: She is well-developed.  HENT:      Head: Atraumatic.     Nose: Nose normal.     Mouth/Throat:     Mouth: Mucous membranes are moist.     Pharynx: No oropharyngeal exudate or posterior oropharyngeal erythema.  Eyes:     Conjunctiva/sclera: Conjunctivae normal.  Cardiovascular:     Rate and Rhythm: Normal rate and regular rhythm.     Pulses: Normal pulses.     Heart sounds: Normal heart sounds.  Pulmonary:     Effort: Pulmonary effort is normal.     Breath sounds: No wheezing, rhonchi or rales.  Abdominal:     Palpations: Abdomen is soft.     Tenderness: There is no abdominal tenderness.  Musculoskeletal:     Cervical back: Normal range of motion and neck supple. No rigidity or tenderness.  Skin:    Findings: No rash.  Neurological:     Mental Status: She is alert. Mental status is at baseline.  Psychiatric:        Mood and Affect: Mood normal.     ED Results / Procedures / Treatments   Labs (all labs ordered are listed, but only abnormal results are displayed) Labs Reviewed  BASIC METABOLIC PANEL - Abnormal; Notable for the following components:      Result Value   Calcium 8.8 (*)    All other components within normal limits  GROUP A STREP BY PCR  RESP PANEL BY RT-PCR (RSV, FLU A&B, COVID)  RVPGX2  CBC  I-STAT BETA HCG BLOOD, ED ([facility], [facility], [facility] ONLY)  TROPONIN I (HIGH SENSITIVITY)  TROPONIN I (HIGH SENSITIVITY)    EKG EKG Interpretation  Date/Time:  Thursday October 10 2022 11:15:54 EST Ventricular Rate:  71 PR Interval:  123 QRS Duration: 86 QT Interval:  385 QTC Calculation: 419 R Axis:   74 Text Interpretation: Sinus rhythm no acute ST/Ts No old tracing to compare Confirmed by Meridee Score [phone]) on 10/10/2022 11:23:15 AM  Radiology DG Chest Port 1 View  Result Date: 10/10/2022 CLINICAL DATA:  Chest pain EXAM: PORTABLE CHEST 1 VIEW COMPARISON:  None Available. FINDINGS: No pleural effusion. No pneumothorax. No focal airspace opacity. Normal cardiac and mediastinal contours. Visualized  upper abdomen is unremarkable. No displaced rib fractures. No acute osseous abnormality. IMPRESSION: No focal airspace opacity. Electronically Signed   By: Lorenza Cambridge M.D.   On: 10/10/2022 11:46    Procedures Procedures    Medications Ordered in ED Medications - No data to display  ED Course/ Medical Decision Making/ A&P                           Medical Decision Making  BP 109/74 (BP Location: Right Arm)   Pulse 82   Temp 98 F (36.7 C) (Oral)   Resp 16   SpO2 100%   82:55 PM  31 year old female significant history of kidney stones, hepatitis, presenting with cold symptoms.  Patient is Spanish-speaking, history obtained using language interpreter.  Patient report for the past 3 days she has had cold symptoms which include throat irritation, occasional cough, chest discomfort, feeling dizzy, and tingling sensation in her hands and congestion.  She tries Tylenol, ibuprofen, drinking tea and honey at home with some relief.  She denies any recent sick contact.  She denies any significant shortness of breath abdominal pain or dysuria.  On exam this is a well-appearing female sitting in the chair appears to be in no acute discomfort.  Unremarkable ear nose and throat exam, heart with normal rate and rhythm, lungs are clear to auscultation, abdomen soft nontender, no concerning rash, throat exam unremarkable.  She is exhibits normal phonation.  Vital signs reviewed and are normal, no fever, no hypoxia  -Labs ordered, independently viewed and interpreted by me.  Labs remarkable for normal viral panel, normal troponin -The patient was maintained on a cardiac monitor.  I personally viewed and interpreted the cardiac monitored which showed an underlying rhythm of: NSR -Imaging independently viewed and interpreted by me and I agree with radiologist's interpretation.  Result remarkable for CXR showing no concerning changes -This patient presents to the ED for concern of cold sxs, this involves an  extensive number of treatment options, and is a complaint that carries with it a high risk of complications and morbidity.  The differential diagnosis includes viral illness, covid, flu, rsv, strep, pna, acs, gerd -Co morbidities that complicate the patient evaluation includes hepatitis -Treatment includes supportive care -Reevaluation of the patient after these medicines showed that the patient improved -PCP office notes or outside notes reviewed -Escalation to admission/observation considered: patients feels much better, is comfortable with discharge, and will follow up with PCP -Prescription medication considered, patient comfortable with tylenol/ibuprofen OTC -Social Determinant of Health considered which includes language barrier         Final Clinical Impression(s) / ED Diagnoses Final diagnoses:  Viral illness    Rx / DC Orders ED Discharge Orders  Ordered    ibuprofen (ADVIL) 600 MG tablet  Every 6 hours PRN        10/10/22 1724              Fayrene Helper, PA-C 10/10/22 1724    Sloan Leiter, DO 10/11/22 0004

## 2022-10-11 ENCOUNTER — Other Ambulatory Visit: Payer: Self-pay

## 2022-10-17 ENCOUNTER — Other Ambulatory Visit: Payer: Self-pay

## 2022-11-19 ENCOUNTER — Ambulatory Visit: Payer: Self-pay | Attending: Nurse Practitioner | Admitting: Nurse Practitioner

## 2023-03-25 ENCOUNTER — Other Ambulatory Visit: Payer: Self-pay

## 2023-03-25 ENCOUNTER — Emergency Department (HOSPITAL_COMMUNITY): Payer: Self-pay

## 2023-03-25 ENCOUNTER — Emergency Department (HOSPITAL_COMMUNITY)
Admission: EM | Admit: 2023-03-25 | Discharge: 2023-03-26 | Disposition: A | Discharge status: Home / Self Care | Payer: Self-pay | Attending: Emergency Medicine | Admitting: Emergency Medicine

## 2023-03-25 DIAGNOSIS — M5441 Lumbago with sciatica, right side: Secondary | ICD-10-CM | POA: Insufficient documentation

## 2023-03-25 DIAGNOSIS — D72829 Elevated white blood cell count, unspecified: Secondary | ICD-10-CM | POA: Insufficient documentation

## 2023-03-25 DIAGNOSIS — R1031 Right lower quadrant pain: Secondary | ICD-10-CM | POA: Insufficient documentation

## 2023-03-25 DIAGNOSIS — M79651 Pain in right thigh: Secondary | ICD-10-CM | POA: Insufficient documentation

## 2023-03-25 DIAGNOSIS — R110 Nausea: Secondary | ICD-10-CM | POA: Insufficient documentation

## 2023-03-25 DIAGNOSIS — R109 Unspecified abdominal pain: Secondary | ICD-10-CM

## 2023-03-25 LAB — URINALYSIS, ROUTINE W REFLEX MICROSCOPIC
Bilirubin Urine: NEGATIVE
Glucose, UA: NEGATIVE mg/dL
Hgb urine dipstick: NEGATIVE
Ketones, ur: NEGATIVE mg/dL
Nitrite: NEGATIVE
Protein, ur: NEGATIVE mg/dL
Specific Gravity, Urine: 1.016 (ref 1.005–1.030)
pH: 7 (ref 5.0–8.0)

## 2023-03-25 LAB — CBC
HCT: 41.3 % (ref 36.0–46.0)
Hemoglobin: 13.3 g/dL (ref 12.0–15.0)
MCH: 27.9 pg (ref 26.0–34.0)
MCHC: 32.2 g/dL (ref 30.0–36.0)
MCV: 86.8 fL (ref 80.0–100.0)
Platelets: 298 10*3/uL (ref 150–400)
RBC: 4.76 MIL/uL (ref 3.87–5.11)
RDW: 13.2 % (ref 11.5–15.5)
WBC: 12 10*3/uL — ABNORMAL HIGH (ref 4.0–10.5)
nRBC: 0 % (ref 0.0–0.2)

## 2023-03-25 LAB — BASIC METABOLIC PANEL
Anion gap: 7 (ref 5–15)
BUN: 15 mg/dL (ref 6–20)
CO2: 24 mmol/L (ref 22–32)
Calcium: 8.6 mg/dL — ABNORMAL LOW (ref 8.9–10.3)
Chloride: 104 mmol/L (ref 98–111)
Creatinine, Ser: 0.7 mg/dL (ref 0.44–1.00)
GFR, Estimated: >60 mL/min (ref >60)
Glucose, Bld: 103 mg/dL — ABNORMAL HIGH (ref 70–99)
Potassium: 3.9 mmol/L (ref 3.5–5.1)
Sodium: 135 mmol/L (ref 135–145)

## 2023-03-25 LAB — I-STAT BETA HCG BLOOD, ED (MC, WL, AP ONLY): I-stat hCG, quantitative: <5 m[IU]/mL (ref <5)

## 2023-03-25 MED ORDER — KETOROLAC TROMETHAMINE 15 MG/ML IJ SOLN
15.0000 mg | Freq: Once | INTRAMUSCULAR | Status: AC
  Administered 2023-03-25: 15 mg via INTRAVENOUS
  Filled 2023-03-25: qty 1

## 2023-03-25 MED ORDER — IBUPROFEN 600 MG PO TABS
600.0000 mg | ORAL_TABLET | Freq: Three times a day (TID) | ORAL | 0 refills | Status: AC | PRN
  Filled 2023-03-25: qty 15, 5d supply, fill #0

## 2023-03-25 MED ORDER — ONDANSETRON HCL 4 MG/2ML IJ SOLN
4.0000 mg | Freq: Once | INTRAMUSCULAR | Status: AC
  Administered 2023-03-25: 4 mg via INTRAVENOUS
  Filled 2023-03-25: qty 2

## 2023-03-25 MED ORDER — SODIUM CHLORIDE 0.9 % IV BOLUS
1000.0000 mL | Freq: Once | INTRAVENOUS | Status: AC
  Administered 2023-03-25: 1000 mL via INTRAVENOUS

## 2023-03-25 MED ORDER — LIDOCAINE 5 % EX PTCH
1.0000 | MEDICATED_PATCH | CUTANEOUS | 0 refills | Status: AC
  Filled 2023-03-25: qty 5, 5d supply, fill #0

## 2023-03-25 MED ORDER — ONDANSETRON HCL 4 MG PO TABS
4.0000 mg | ORAL_TABLET | Freq: Three times a day (TID) | ORAL | 0 refills | Status: AC | PRN
  Filled 2023-03-25: qty 12, 4d supply, fill #0

## 2023-03-25 MED ORDER — CYCLOBENZAPRINE HCL 10 MG PO TABS
10.0000 mg | ORAL_TABLET | Freq: Three times a day (TID) | ORAL | 0 refills | Status: AC | PRN
  Filled 2023-03-25: qty 15, 5d supply, fill #0

## 2023-03-25 MED ORDER — MORPHINE SULFATE (PF) 4 MG/ML IV SOLN
4.0000 mg | Freq: Once | INTRAVENOUS | Status: AC
  Administered 2023-03-25: 4 mg via INTRAVENOUS
  Filled 2023-03-25: qty 1

## 2023-03-25 MED ORDER — PREDNISONE 10 MG PO TABS
40.0000 mg | ORAL_TABLET | Freq: Every day | ORAL | 0 refills | Status: AC
  Filled 2023-03-25: qty 20, 5d supply, fill #0

## 2023-03-25 NOTE — Discharge Instructions (Signed)
Thank you for letting us take care of you today.  Overall, your workup was reassuring including your blood work, urine, ultrasound, and CT scan which were all essentially normal.  The way you describe your pain does make me question if it is related to a condition called sciatica.  I am prescribing multiple medications and provided exercises that may help with this.  Please follow-up with your primary care provider later this week for reevaluation and to see if the medications are helping and your symptoms are resolving.  If not, they may want to refer you to a specialist, physical therapy, or do additional testing.  For any new or worsening symptoms, please return to the nearest emergency department for reevaluation.  Gracias por dejarnos cuidar de usted hoy.  En general, su anlisis fue tranquilizador, incluidos los [place], [place], [place] y tomografa computarizada, que fueron esencialmente normales.  La forma en que describe su dolor me hace preguntarme si est relacionado con una afeccin llamada citica.  Le estoy recetando varios medicamentos y proporcion ejercicios que pueden ayudar con esto.  Haga un seguimiento con su proveedor de atencin primaria a finales de esta semana para una reevaluacin y para ver si los medicamentos estn ayudando y sus sntomas se estn resolviendo.  De lo contrario, es posible que quieran derivarlo a Music therapist, fisioterapia o Wellsite geologist.  Ante cualquier sntoma nuevo o que empeore, regrese al departamento de emergencias ms cercano para una reevaluacin.

## 2023-03-25 NOTE — ED Triage Notes (Signed)
Pt arrives c/o pain to R side of her body - first started in R flank area but now radiating down anterior thigh. Also c/o nausea without emesis. Symptoms started months ago, but has been worse lately - mostly since last night. Pt states pain is so intense that she cannot sleep. Has been taking tylenol - last dose around noon. States that she had some kidney stones with surgery and lithotripsy to L side 8-9 years ago. Denies dysuria or hematuria at this time. States urine is more yellow in color than usual.  Pt also adds that she has had a couple episodes of hyperventilation where she felt whole body numbness within the past month, most recently 3 weeks ago. Pt denies feeling anxious at this time. States that she started feeling dizziness initially during these episodes.  Spanish interpreter used for triage process.

## 2023-03-26 ENCOUNTER — Other Ambulatory Visit: Payer: Self-pay

## 2023-03-26 MED ORDER — ONDANSETRON HCL 4 MG/2ML IJ SOLN
4.0000 mg | Freq: Once | INTRAMUSCULAR | Status: AC
  Administered 2023-03-26: 4 mg via INTRAVENOUS
  Filled 2023-03-26: qty 2

## 2023-03-26 MED ORDER — MORPHINE SULFATE (PF) 4 MG/ML IV SOLN
4.0000 mg | Freq: Once | INTRAVENOUS | Status: AC
  Administered 2023-03-26: 4 mg via INTRAVENOUS
  Filled 2023-03-26: qty 1

## 2023-03-27 NOTE — ED Provider Notes (Signed)
[place] EMERGENCY DEPARTMENT AT [facility] Provider Note   CSN: [id number] Arrival date & time: 03/25/23  7829   Spanish language interpretor used throughout patient encounter  History  No chief complaint on file.   Emily Madden is a 32 y.o. female with past medical history of kidney stones who presents to the ED complaining of right flank and right lower quadrant abdominal pain.  She states that this started several months ago but has been worsening for the last few weeks.  Notes that sometimes pain feels like it radiates around the right flank to the right lower quadrant and then down the right anterior and then lateral thigh.  She has been taking Tylenol without relief of pain.  She denies associated dysuria, hematuria, fever, chills, vomiting, diarrhea, vaginal bleeding or discharge.  Some associated nausea and urine is darker than normal. No bowel or bladder dysfunction, leg weakness, saddle anesthesia, paresthesias, chest pain, or shortness of breath. No previous abdominal surgeries.       Home Medications Prior to Admission medications   Medication Sig Start Date End Date Taking? Authorizing Provider  cyclobenzaprine (FLEXERIL) 10 MG tablet Take 1 tablet (10 mg total) by mouth 3 (three) times daily as needed for up to 5 days for muscle spasms. 03/25/23 03/31/23 Yes Fabiola Mudgett L, PA-C  ibuprofen (ADVIL) 600 MG tablet Take 1 tablet (600 mg total) by mouth every 8 (eight) hours as needed for up to 5 days for moderate pain or mild pain. 03/25/23 03/31/23 Yes Sabina Beavers L, PA-C  lidocaine (LIDODERM) 5 % Place 1 patch onto the skin daily for 5 days. Remove & Discard patch within 12 hours or as directed by MD 03/25/23 03/31/23 Yes Orlando Thalmann L, PA-C  ondansetron (ZOFRAN) 4 MG tablet Take 1 tablet (4 mg total) by mouth every 8 (eight) hours as needed for up to 5 days for nausea or vomiting. 03/25/23 03/30/23 Yes Dakwon Wenberg L, PA-C  predniSONE (DELTASONE) 10 MG tablet  Take 4 tablets (40 mg total) by mouth daily for 5 days. 03/25/23 03/31/23 Yes Cornell Gaber L, PA-C  mupirocin ointment (BACTROBAN) 2 % Apply 1 application. topically 2 (two) times daily. To affected area till better 03/19/22   Zenia Resides, MD      Allergies    Patient has no known allergies.    Review of Systems   Review of Systems  All other systems reviewed and are negative.   Physical Exam Updated Vital Signs BP 105/75   Pulse (!) 50   Temp 98.4 F (36.9 C) (Oral)   Resp 14   LMP 03/14/2023 (Exact Date)   SpO2 99%  Physical Exam Vitals and nursing note reviewed.  Constitutional:      General: She is not in acute distress.    Appearance: Normal appearance. She is not ill-appearing, toxic-appearing or diaphoretic.  HENT:     Head: Normocephalic and atraumatic.     Mouth/Throat:     Mouth: Mucous membranes are moist.  Eyes:     General: No scleral icterus.    Extraocular Movements: Extraocular movements intact.     Conjunctiva/sclera: Conjunctivae normal.  Cardiovascular:     Rate and Rhythm: Normal rate and regular rhythm.     Heart sounds: No murmur heard. Pulmonary:     Effort: Pulmonary effort is normal.     Breath sounds: Normal breath sounds.  Abdominal:     General: Abdomen is flat. There is no distension.  Palpations: Abdomen is soft.     Tenderness: There is abdominal tenderness (mild R CVA, RLQ, suprapubic). There is no left CVA tenderness, guarding or rebound.  Musculoskeletal:        General: Normal range of motion.     Cervical back: Normal range of motion and neck supple. No rigidity.     Right lower leg: No edema.     Left lower leg: No edema.     Comments: No midline CTL spinal tenderness, stepoffs, or deformities, moving all 4 extremities equally and spontaneously, 5/5 strength to bilateral UE and LE  Skin:    General: Skin is warm and dry.     Capillary Refill: Capillary refill takes less than 2 seconds.     Coloration: Skin is not  jaundiced or pale.     Findings: No rash.  Neurological:     General: No focal deficit present.     Mental Status: She is alert and oriented to person, place, and time.     Cranial Nerves: No cranial nerve deficit.  Psychiatric:        Mood and Affect: Mood normal.        Behavior: Behavior normal.     ED Results / Procedures / Treatments   Labs (all labs ordered are listed, but only abnormal results are displayed) Labs Reviewed  URINALYSIS, ROUTINE W REFLEX MICROSCOPIC - Abnormal; Notable for the following components:      Result Value   Leukocytes,Ua SMALL (*)    Bacteria, UA RARE (*)    All other components within normal limits  BASIC METABOLIC PANEL - Abnormal; Notable for the following components:   Glucose, Bld 103 (*)    Calcium 8.6 (*)    All other components within normal limits  CBC - Abnormal; Notable for the following components:   WBC 12.0 (*)    All other components within normal limits  I-STAT BETA HCG BLOOD, ED ([facility], [facility], [facility] ONLY)    EKG None  Radiology US Pelvis Complete  Result Date: 03/25/2023 CLINICAL DATA:  Pelvic pain EXAM: TRANSABDOMINAL AND TRANSVAGINAL ULTRASOUND OF PELVIS DOPPLER ULTRASOUND OF OVARIES TECHNIQUE: Both transabdominal and transvaginal ultrasound examinations of the pelvis were performed. Transabdominal technique was performed for global imaging of the pelvis including uterus, ovaries, adnexal regions, and pelvic cul-de-sac. It was necessary to proceed with endovaginal exam following the transabdominal exam to visualize the uterus, endometrium, ovaries and adnexa. Color and duplex Doppler ultrasound was utilized to evaluate blood flow to the ovaries. COMPARISON:  CT today FINDINGS: Uterus Measurements: 7.2 x 2.9 x 4.2 cm = volume: 45 mL. Left posterior fundal fibroid measures 1.5 cm Endometrium Thickness: 10 mm in thickness.  No focal abnormality visualized. Right ovary Measurements: 2.9 x 1.9 x 2.0 cm = volume: 5.6 mL. Dominant follicle. No  adnexal mass. Left ovary Measurements: 2.6 x 1.5 x 1.9 cm = volume: 3.8 mL. Normal appearance/no adnexal mass. Pulsed Doppler evaluation of both ovaries demonstrates normal low-resistance arterial and venous waveforms. Other findings No abnormal free fluid. IMPRESSION: Small left posterior fundal fibroid. No acute findings. Electronically Signed   By: Charlett Nose M.D.   On: 03/25/2023 23:27   US Transvaginal Non-OB  Result Date: 03/25/2023 CLINICAL DATA:  Pelvic pain EXAM: TRANSABDOMINAL AND TRANSVAGINAL ULTRASOUND OF PELVIS DOPPLER ULTRASOUND OF OVARIES TECHNIQUE: Both transabdominal and transvaginal ultrasound examinations of the pelvis were performed. Transabdominal technique was performed for global imaging of the pelvis including uterus, ovaries, adnexal regions, and pelvic cul-de-sac. It  was necessary to proceed with endovaginal exam following the transabdominal exam to visualize the uterus, endometrium, ovaries and adnexa. Color and duplex Doppler ultrasound was utilized to evaluate blood flow to the ovaries. COMPARISON:  CT today FINDINGS: Uterus Measurements: 7.2 x 2.9 x 4.2 cm = volume: 45 mL. Left posterior fundal fibroid measures 1.5 cm Endometrium Thickness: 10 mm in thickness.  No focal abnormality visualized. Right ovary Measurements: 2.9 x 1.9 x 2.0 cm = volume: 5.6 mL. Dominant follicle. No adnexal mass. Left ovary Measurements: 2.6 x 1.5 x 1.9 cm = volume: 3.8 mL. Normal appearance/no adnexal mass. Pulsed Doppler evaluation of both ovaries demonstrates normal low-resistance arterial and venous waveforms. Other findings No abnormal free fluid. IMPRESSION: Small left posterior fundal fibroid. No acute findings. Electronically Signed   By: Charlett Nose M.D.   On: 03/25/2023 23:27   US PELVIC DOPPLER (TORSION R/O OR MASS ARTERIAL FLOW)  Result Date: 03/25/2023 CLINICAL DATA:  Pelvic pain EXAM: TRANSABDOMINAL AND TRANSVAGINAL ULTRASOUND OF PELVIS DOPPLER ULTRASOUND OF OVARIES TECHNIQUE: Both  transabdominal and transvaginal ultrasound examinations of the pelvis were performed. Transabdominal technique was performed for global imaging of the pelvis including uterus, ovaries, adnexal regions, and pelvic cul-de-sac. It was necessary to proceed with endovaginal exam following the transabdominal exam to visualize the uterus, endometrium, ovaries and adnexa. Color and duplex Doppler ultrasound was utilized to evaluate blood flow to the ovaries. COMPARISON:  CT today FINDINGS: Uterus Measurements: 7.2 x 2.9 x 4.2 cm = volume: 45 mL. Left posterior fundal fibroid measures 1.5 cm Endometrium Thickness: 10 mm in thickness.  No focal abnormality visualized. Right ovary Measurements: 2.9 x 1.9 x 2.0 cm = volume: 5.6 mL. Dominant follicle. No adnexal mass. Left ovary Measurements: 2.6 x 1.5 x 1.9 cm = volume: 3.8 mL. Normal appearance/no adnexal mass. Pulsed Doppler evaluation of both ovaries demonstrates normal low-resistance arterial and venous waveforms. Other findings No abnormal free fluid. IMPRESSION: Small left posterior fundal fibroid. No acute findings. Electronically Signed   By: Charlett Nose M.D.   On: 03/25/2023 23:27   CT ABDOMEN PELVIS WO CONTRAST  Result Date: 03/25/2023 CLINICAL DATA:  Abdominal/flank pain, stone suspected. EXAM: CT ABDOMEN AND PELVIS WITHOUT CONTRAST TECHNIQUE: Multidetector CT imaging of the abdomen and pelvis was performed following the standard protocol without IV contrast. RADIATION DOSE REDUCTION: This exam was performed according to the departmental dose-optimization program which includes automated exposure control, adjustment of the mA and/or kV according to patient size and/or use of iterative reconstruction technique. COMPARISON:  CT examination dated June 08, 2020 FINDINGS: Lower chest: Bibasilar dependent atelectasis.  No acute abnormality. Hepatobiliary: No focal liver abnormality is seen. No gallstones, gallbladder wall thickening, or biliary dilatation. Pancreas:  Unremarkable. No pancreatic ductal dilatation or surrounding inflammatory changes. Spleen: Normal in size without focal abnormality. Adrenals/Urinary Tract: Adrenal glands are unremarkable. 2 mm nonobstructing calculus in the upper pole of the right kidney. 2 mm nonobstructing calculus in the upper and lower pole of the left kidney. No evidence of hydronephrosis or ureteral calculus. Left renal vascular calcification. Bladder is unremarkable. Stomach/Bowel: Stomach is within normal limits. Appendix not definitely identified, however no inflammatory changes in the pericecal region. No evidence of bowel wall thickening, distention, or inflammatory changes. Vascular/Lymphatic: No significant vascular findings are present. No enlarged abdominal or pelvic lymph nodes. Reproductive: Uterus and bilateral adnexa are unremarkable. Other: No abdominal wall hernia or abnormality. No abdominopelvic ascites. Musculoskeletal: No acute or significant osseous findings. IMPRESSION: 1. 2 mm nonobstructing calculi in  the upper pole of the right kidney and upper and lower pole of the left kidney. No evidence of hydronephrosis or ureteral calculus. 2. Bowel loops are normal in caliber. No evidence of colitis or diverticulitis. Appendix not definitely identified, however no inflammatory changes in the pericecal region. 3. Uterus and adnexa are unremarkable. 4. No CT evidence of acute abdominal/pelvic process. Electronically Signed   By: Larose Hires D.O.   On: 03/25/2023 22:05    Procedures Procedures    Medications Ordered in ED Medications  sodium chloride 0.9 % bolus 1,000 mL (0 mLs Intravenous Stopped 03/26/23 0046)  ketorolac (TORADOL) 15 MG/ML injection 15 mg (15 mg Intravenous Given 03/25/23 2158)  ondansetron (ZOFRAN) injection 4 mg (4 mg Intravenous Given 03/25/23 2158)  morphine (PF) 4 MG/ML injection 4 mg (4 mg Intravenous Given 03/25/23 2308)  morphine (PF) 4 MG/ML injection 4 mg (4 mg Intravenous Given 03/26/23 0039)   ondansetron (ZOFRAN) injection 4 mg (4 mg Intravenous Given 03/26/23 0039)    ED Course/ Medical Decision Making/ A&P                             Medical Decision Making Amount and/or Complexity of Data Reviewed Labs: ordered. Decision-making details documented in ED Course. Radiology: ordered. Decision-making details documented in ED Course.  Risk Prescription drug management.   Medical Decision Making:   Emily Madden is a 32 y.o. female who presented to the ED today with abdominal pain detailed above.    Patient's presentation is complicated by their history of kidney stones.  Patient placed on continuous vitals and telemetry monitoring while in ED which was reviewed periodically.  Complete initial physical exam performed, notably the patient  was in NAD. RRR, LCTA. Neurologically intact. No midline spinal tenderness or deformities. R CVA, RLQ, suprapubic abdominal tenderness without guarding, rebound, or peritoneal signs. No distension. Pt nontoxic appearing.    Reviewed and confirmed nursing documentation for past medical history, family history, social history.    Initial Assessment:   With the patient's presentation of abdominal pain, differential diagnosis includes but is not limited to AAA, mesenteric ischemia, appendicitis, diverticulitis, DKA, gastritis, gastroenteritis, AMI, nephrolithiasis, pancreatitis, peritonitis, adrenal insufficiency, intestinal ischemia, constipation, UTI, SBO/LBO, splenic rupture, biliary disease, IBD, IBS, PUD, hepatitis, STD, ovarian/testicular torsion, electrolyte disturbance, DKA, dehydration, acute kidney injury, renal failure, cholecystitis, cholelithiasis, choledocholithiasis, abdominal pain of  unknown etiology, pregnancy, incomplete abortion, septic abortion, threatened abortion, ectopic pregnancy, PID.   Initial Plan:  Screening labs including CBC and Metabolic panel to evaluate for infectious or metabolic etiology of disease.  Lipase  to evaluate for pancreatitis Urinalysis with reflex culture ordered to evaluate for UTI or relevant urologic/nephrologic pathology.  CT abd/pelvis to evaluate for intra-abdominal pathology Pelvic ultrasound to assess for etiology of lower abdominal pain EKG to evaluate for cardiac pathology Symptomatic management Objective evaluation as reviewed   Initial Study Results:   Laboratory  All laboratory results reviewed without evidence of clinically relevant pathology.   Exceptions include: WBC 12, UA with small leukocytes  Radiology:  All images reviewed independently. Agree with radiology report at this time.   US Pelvis Complete  Result Date: 03/25/2023 CLINICAL DATA:  Pelvic pain EXAM: TRANSABDOMINAL AND TRANSVAGINAL ULTRASOUND OF PELVIS DOPPLER ULTRASOUND OF OVARIES TECHNIQUE: Both transabdominal and transvaginal ultrasound examinations of the pelvis were performed. Transabdominal technique was performed for global imaging of the pelvis including uterus, ovaries, adnexal regions, and pelvic cul-de-sac. It was necessary  to proceed with endovaginal exam following the transabdominal exam to visualize the uterus, endometrium, ovaries and adnexa. Color and duplex Doppler ultrasound was utilized to evaluate blood flow to the ovaries. COMPARISON:  CT today FINDINGS: Uterus Measurements: 7.2 x 2.9 x 4.2 cm = volume: 45 mL. Left posterior fundal fibroid measures 1.5 cm Endometrium Thickness: 10 mm in thickness.  No focal abnormality visualized. Right ovary Measurements: 2.9 x 1.9 x 2.0 cm = volume: 5.6 mL. Dominant follicle. No adnexal mass. Left ovary Measurements: 2.6 x 1.5 x 1.9 cm = volume: 3.8 mL. Normal appearance/no adnexal mass. Pulsed Doppler evaluation of both ovaries demonstrates normal low-resistance arterial and venous waveforms. Other findings No abnormal free fluid. IMPRESSION: Small left posterior fundal fibroid. No acute findings. Electronically Signed   By: Charlett Nose M.D.   On:  03/25/2023 23:27   US Transvaginal Non-OB  Result Date: 03/25/2023 CLINICAL DATA:  Pelvic pain EXAM: TRANSABDOMINAL AND TRANSVAGINAL ULTRASOUND OF PELVIS DOPPLER ULTRASOUND OF OVARIES TECHNIQUE: Both transabdominal and transvaginal ultrasound examinations of the pelvis were performed. Transabdominal technique was performed for global imaging of the pelvis including uterus, ovaries, adnexal regions, and pelvic cul-de-sac. It was necessary to proceed with endovaginal exam following the transabdominal exam to visualize the uterus, endometrium, ovaries and adnexa. Color and duplex Doppler ultrasound was utilized to evaluate blood flow to the ovaries. COMPARISON:  CT today FINDINGS: Uterus Measurements: 7.2 x 2.9 x 4.2 cm = volume: 45 mL. Left posterior fundal fibroid measures 1.5 cm Endometrium Thickness: 10 mm in thickness.  No focal abnormality visualized. Right ovary Measurements: 2.9 x 1.9 x 2.0 cm = volume: 5.6 mL. Dominant follicle. No adnexal mass. Left ovary Measurements: 2.6 x 1.5 x 1.9 cm = volume: 3.8 mL. Normal appearance/no adnexal mass. Pulsed Doppler evaluation of both ovaries demonstrates normal low-resistance arterial and venous waveforms. Other findings No abnormal free fluid. IMPRESSION: Small left posterior fundal fibroid. No acute findings. Electronically Signed   By: Charlett Nose M.D.   On: 03/25/2023 23:27   US PELVIC DOPPLER (TORSION R/O OR MASS ARTERIAL FLOW)  Result Date: 03/25/2023 CLINICAL DATA:  Pelvic pain EXAM: TRANSABDOMINAL AND TRANSVAGINAL ULTRASOUND OF PELVIS DOPPLER ULTRASOUND OF OVARIES TECHNIQUE: Both transabdominal and transvaginal ultrasound examinations of the pelvis were performed. Transabdominal technique was performed for global imaging of the pelvis including uterus, ovaries, adnexal regions, and pelvic cul-de-sac. It was necessary to proceed with endovaginal exam following the transabdominal exam to visualize the uterus, endometrium, ovaries and adnexa. Color and  duplex Doppler ultrasound was utilized to evaluate blood flow to the ovaries. COMPARISON:  CT today FINDINGS: Uterus Measurements: 7.2 x 2.9 x 4.2 cm = volume: 45 mL. Left posterior fundal fibroid measures 1.5 cm Endometrium Thickness: 10 mm in thickness.  No focal abnormality visualized. Right ovary Measurements: 2.9 x 1.9 x 2.0 cm = volume: 5.6 mL. Dominant follicle. No adnexal mass. Left ovary Measurements: 2.6 x 1.5 x 1.9 cm = volume: 3.8 mL. Normal appearance/no adnexal mass. Pulsed Doppler evaluation of both ovaries demonstrates normal low-resistance arterial and venous waveforms. Other findings No abnormal free fluid. IMPRESSION: Small left posterior fundal fibroid. No acute findings. Electronically Signed   By: Charlett Nose M.D.   On: 03/25/2023 23:27   CT ABDOMEN PELVIS WO CONTRAST  Result Date: 03/25/2023 CLINICAL DATA:  Abdominal/flank pain, stone suspected. EXAM: CT ABDOMEN AND PELVIS WITHOUT CONTRAST TECHNIQUE: Multidetector CT imaging of the abdomen and pelvis was performed following the standard protocol without IV contrast. RADIATION DOSE REDUCTION: This  exam was performed according to the departmental dose-optimization program which includes automated exposure control, adjustment of the mA and/or kV according to patient size and/or use of iterative reconstruction technique. COMPARISON:  CT examination dated June 08, 2020 FINDINGS: Lower chest: Bibasilar dependent atelectasis.  No acute abnormality. Hepatobiliary: No focal liver abnormality is seen. No gallstones, gallbladder wall thickening, or biliary dilatation. Pancreas: Unremarkable. No pancreatic ductal dilatation or surrounding inflammatory changes. Spleen: Normal in size without focal abnormality. Adrenals/Urinary Tract: Adrenal glands are unremarkable. 2 mm nonobstructing calculus in the upper pole of the right kidney. 2 mm nonobstructing calculus in the upper and lower pole of the left kidney. No evidence of hydronephrosis or ureteral  calculus. Left renal vascular calcification. Bladder is unremarkable. Stomach/Bowel: Stomach is within normal limits. Appendix not definitely identified, however no inflammatory changes in the pericecal region. No evidence of bowel wall thickening, distention, or inflammatory changes. Vascular/Lymphatic: No significant vascular findings are present. No enlarged abdominal or pelvic lymph nodes. Reproductive: Uterus and bilateral adnexa are unremarkable. Other: No abdominal wall hernia or abnormality. No abdominopelvic ascites. Musculoskeletal: No acute or significant osseous findings. IMPRESSION: 1. 2 mm nonobstructing calculi in the upper pole of the right kidney and upper and lower pole of the left kidney. No evidence of hydronephrosis or ureteral calculus. 2. Bowel loops are normal in caliber. No evidence of colitis or diverticulitis. Appendix not definitely identified, however no inflammatory changes in the pericecal region. 3. Uterus and adnexa are unremarkable. 4. No CT evidence of acute abdominal/pelvic process. Electronically Signed   By: Larose Hires D.O.   On: 03/25/2023 22:05     Final Assessment and Plan:   32 year old female presents to the ED for evaluation of right-sided flank pain. There is a characteristic of the pain that radiates down to her right thigh.  Symptoms have been ongoing for some time now.  She has no midline spinal tenderness on exam.  No red flag symptoms for back pain.  No associated fever or infectious symptoms.  No vaginal bleeding or discharge.  Associated nausea but no vomiting or diarrhea.  No urinary symptoms.  Remote history of kidney stones, unsure if this feels similar. Pt nontoxic appearing. Pain somewhat sounds sciatic in nature though with RLQ, suprapubic abdominal tenderness will proceed with abdominal workup to rule out emergent cause of symptoms. Vital signs stable. No AKI, no significant electrolyte disturbance.  Mild leukocytosis at 12.  Hemoglobin normal.  Pregnancy test negative. UA does not appear infected. No explanation of pt's symptoms on CTAP. [place] ordered for further evaluation. Small fibroid identified but no emergent findings. Pt with good pain control with medications given in ED. Given description of pain, will give medications to help with possible sciatica. Pt expressed understanding of plan. Nonfocal neuro exam. Will provide with primary care follow up and strict ED return precautions. All questions answered and stable for discharge.    Clinical Impression:  1. Acute right-sided low back pain with right-sided sciatica   2. Right flank pain      Discharge           Final Clinical Impression(s) / ED Diagnoses Final diagnoses:  Right flank pain  Acute right-sided low back pain with right-sided sciatica    Rx / DC Orders ED Discharge Orders          Ordered    cyclobenzaprine (FLEXERIL) 10 MG tablet  3 times daily PRN        03/25/23 2357    predniSONE (DELTASONE)  10 MG tablet  Daily        03/25/23 2357    ondansetron (ZOFRAN) 4 MG tablet  Every 8 hours PRN        03/25/23 2357    ibuprofen (ADVIL) 600 MG tablet  Every 8 hours PRN        03/25/23 2357    lidocaine (LIDODERM) 5 %  Every 24 hours        03/25/23 2357              Tonette Lederer, PA-C 03/27/23 1620    Charlynne Pander, MD 03/27/23 2104

## 2023-05-14 ENCOUNTER — Ambulatory Visit: Payer: Self-pay | Attending: Nurse Practitioner | Admitting: Nurse Practitioner

## 2023-05-14 ENCOUNTER — Other Ambulatory Visit: Payer: Self-pay

## 2023-05-14 ENCOUNTER — Other Ambulatory Visit: Payer: Self-pay | Admitting: Nurse Practitioner

## 2023-05-14 ENCOUNTER — Encounter: Payer: Self-pay | Admitting: Nurse Practitioner

## 2023-05-14 VITALS — BP 97/60 | HR 65 | Ht 65.0 in | Wt 151.8 lb

## 2023-05-14 DIAGNOSIS — W57XXXA Bitten or stung by nonvenomous insect and other nonvenomous arthropods, initial encounter: Secondary | ICD-10-CM

## 2023-05-14 DIAGNOSIS — S90562A Insect bite (nonvenomous), left ankle, initial encounter: Secondary | ICD-10-CM

## 2023-05-14 DIAGNOSIS — M5441 Lumbago with sciatica, right side: Secondary | ICD-10-CM

## 2023-05-14 DIAGNOSIS — D72829 Elevated white blood cell count, unspecified: Secondary | ICD-10-CM

## 2023-05-14 MED ORDER — IBUPROFEN 600 MG PO TABS
600.0000 mg | ORAL_TABLET | Freq: Three times a day (TID) | ORAL | 0 refills | Status: DC | PRN
Start: 2023-05-14 — End: 2023-10-07
  Filled 2023-05-14 – 2023-05-21 (×2): qty 60, 20d supply, fill #0

## 2023-05-14 MED ORDER — LIDOCAINE 5 % EX PTCH
1.0000 | MEDICATED_PATCH | CUTANEOUS | 0 refills | Status: AC
Start: 2023-05-14 — End: ?
  Filled 2023-05-14 – 2023-05-21 (×2): qty 30, 30d supply, fill #0

## 2023-05-14 MED ORDER — HYDROCORTISONE 2.5 % EX CREA
TOPICAL_CREAM | Freq: Two times a day (BID) | CUTANEOUS | 1 refills | Status: AC
Start: 2023-05-14 — End: ?
  Filled 2023-05-14 – 2023-05-21 (×2): qty 30, 15d supply, fill #0

## 2023-05-14 NOTE — Progress Notes (Signed)
Annual   Assessment & Plan:  Lesta was seen today for back pain and insect bite.  Diagnoses and all orders for this visit:  Acute right-sided low back pain with right-sided sciatica -     Urinalysis, Complete -     lidocaine (LIDODERM) 5 %; Place 1 patch onto the skin daily. FOR BACK PAIN. Remove & Discard patch within 24 hours or as directed by MD -     ibuprofen (ADVIL) 600 MG tablet; Take 1 tablet (600 mg total) by mouth every 8 (eight) hours as needed. For back pain  Leukocytosis, unspecified type -     CBC with Differential  Insect bite of left ankle, initial encounter -     hydrocortisone 2.5 % cream; Apply topically 2 (two) times daily. Apply to left ankle. If no improvement in a few days will send bactroban.    Patient has been counseled on age-appropriate routine health concerns for screening and prevention. These are reviewed and up-to-date. Referrals have been placed accordingly. Immunizations are up-to-date or declined.    Subjective:   Chief Complaint  Patient presents with   Back Pain   Insect Bite   HPI Emily Madden 32 y.o. female presents to office today for acute low back pain and skin reaction to mosquito bite  HFU She was evaluated in the emergency room on Mar 25, 2023 with complaints of right flank pain the right lower quadrant abdominal pain.   Per ED note:  32 year old female presents to the ED for evaluation of right-sided flank pain. There is a characteristic of the pain that radiates down to her right thigh.  Symptoms have been ongoing for some time now.  She has no midline spinal tenderness on exam.  No red flag symptoms for back pain.  No associated fever or infectious symptoms.  No vaginal bleeding or discharge.  Associated nausea but no vomiting or diarrhea.  No urinary symptoms.  Remote history of kidney stones, unsure if this feels similar. Pt nontoxic appearing. Pain somewhat sounds sciatic in nature though with RLQ, suprapubic abdominal  tenderness will proceed with abdominal workup to rule out emergent cause of symptoms. Vital signs stable. No AKI, no significant electrolyte disturbance.  Mild leukocytosis at 12.  Hemoglobin normal. Pregnancy test negative. UA does not appear infected. No explanation of pt's symptoms on CTAP. [place] ordered for further evaluation. Small fibroid identified but no emergent findings. Pt with good pain control with medications given in ED. Given description of pain, will give medications to help with possible sciatica. Pt expressed understanding of plan. Nonfocal neuro exam. Will provide with primary care follow up and strict ED return precautions. All questions answered and stable for discharge.   Today she states right sided low back pain with right sciatica continues but infrequent. Radiates to right hip and down to thigh. She has stopped working out as she was afraid this would worsen her symptoms. Pain lasts for 6-7 minutes and goes away on its own.  She denies experiencing any back pain like this in the past.    Insect Bite She has a mosquito bite on the lower left leg above the medial malleolar area. The area has mild erythema and swelling as she reports she has been scratching the area due to pruritus. There has been no purulent drainage from the area.    Review of Systems  Constitutional:  Negative for fever, malaise/fatigue and weight loss.  HENT: Negative.  Negative for nosebleeds.   Eyes: Negative.  Negative for blurred vision, double vision and photophobia.  Respiratory: Negative.  Negative for cough and shortness of breath.   Cardiovascular: Negative.  Negative for chest pain, palpitations and leg swelling.  Gastrointestinal: Negative.  Negative for heartburn, nausea and vomiting.  Genitourinary: Negative.   Musculoskeletal:  Positive for back pain. Negative for myalgias.  Skin:  Positive for itching and rash.  Neurological: Negative.  Negative for dizziness, focal weakness, seizures and  headaches.  Psychiatric/Behavioral: Negative.  Negative for suicidal ideas.     Past Medical History:  Diagnosis Date   Hepatitis    Kidney stones     Past Surgical History:  Procedure Laterality Date   KIDNEY STONE SURGERY     left kidney     Family History  Problem Relation Age of Onset   Cancer Mother    Hypertension Mother    Hyperlipidemia Mother    Breast cancer Mother    Heart disease Father    Breast cancer Maternal Aunt    Stroke Maternal Aunt    Stroke Maternal Aunt    Breast cancer Maternal Grandmother    Cancer Maternal Grandfather     Social History Reviewed with no changes to be made today.   Outpatient Medications Prior to Visit  Medication Sig Dispense Refill   mupirocin ointment (BACTROBAN) 2 % Apply 1 application. topically 2 (two) times daily. To affected area till better (Patient not taking: Reported on 05/14/2023) 22 g 0   No facility-administered medications prior to visit.    No Known Allergies     Objective:    BP 97/60 (BP Location: Left Arm, Patient Position: Sitting, Cuff Size: Normal)   Pulse 65   Ht 5\' 5"  (1.651 m)   Wt 151 lb 12.8 oz (68.9 kg)   LMP 05/07/2023 (Exact Date)   SpO2 100%   BMI 25.26 kg/m  Wt Readings from Last 3 Encounters:  05/14/23 151 lb 12.8 oz (68.9 kg)  12/27/21 148 lb 9.6 oz (67.4 kg)  11/28/21 150 lb 2 oz (68.1 kg)    Physical Exam Vitals and nursing note reviewed.  Constitutional:      Appearance: She is well-developed.  HENT:     Head: Normocephalic and atraumatic.  Cardiovascular:     Rate and Rhythm: Normal rate and regular rhythm.     Heart sounds: Normal heart sounds. No murmur heard.    No friction rub. No gallop.  Pulmonary:     Effort: Pulmonary effort is normal. No tachypnea or respiratory distress.     Breath sounds: Normal breath sounds. No decreased breath sounds, wheezing, rhonchi or rales.  Chest:     Chest wall: No tenderness.  Abdominal:     General: Bowel sounds are normal.      Palpations: Abdomen is soft.  Musculoskeletal:        General: Normal range of motion.     Cervical back: Normal range of motion.  Skin:    General: Skin is warm and dry.     Findings: Erythema and rash present. Rash is macular.       Neurological:     Mental Status: She is alert and oriented to person, place, and time.     Coordination: Coordination normal.  Psychiatric:        Behavior: Behavior normal. Behavior is cooperative.        Thought Content: Thought content normal.        Judgment: Judgment normal.  Patient has been counseled extensively about nutrition and exercise as well as the importance of adherence with medications and regular follow-up. The patient was given clear instructions to go to ER or return to medical center if symptoms don't improve, worsen or new problems develop. The patient verbalized understanding.   Follow-up: Return in about 7 weeks (around 07/04/2023) for physical.   Claiborne Rigg, FNP-BC [facility] and [facility] [place], [place] [phone]   05/14/2023, 10:48 AM

## 2023-05-15 ENCOUNTER — Other Ambulatory Visit: Payer: Self-pay | Admitting: Nurse Practitioner

## 2023-05-15 DIAGNOSIS — R82998 Other abnormal findings in urine: Secondary | ICD-10-CM

## 2023-05-15 LAB — CBC WITH DIFFERENTIAL/PLATELET
Basophils Absolute: 0 10*3/uL (ref 0.0–0.2)
Basos: 1 %
EOS (ABSOLUTE): 0.2 10*3/uL (ref 0.0–0.4)
Eos: 3 %
Hematocrit: 37.2 % (ref 34.0–46.6)
Hemoglobin: 11.7 g/dL (ref 11.1–15.9)
Immature Grans (Abs): 0 10*3/uL (ref 0.0–0.1)
Immature Granulocytes: 0 %
Lymphocytes Absolute: 1.8 10*3/uL (ref 0.7–3.1)
Lymphs: 24 %
MCH: 27.4 pg (ref 26.6–33.0)
MCHC: 31.5 g/dL (ref 31.5–35.7)
MCV: 87 fL (ref 79–97)
Monocytes Absolute: 0.8 10*3/uL (ref 0.1–0.9)
Monocytes: 10 %
Neutrophils Absolute: 4.7 10*3/uL (ref 1.4–7.0)
Neutrophils: 62 %
Platelets: 305 10*3/uL (ref 150–450)
RBC: 4.27 x10E6/uL (ref 3.77–5.28)
RDW: 13 % (ref 11.7–15.4)
WBC: 7.6 10*3/uL (ref 3.4–10.8)

## 2023-05-15 LAB — URINALYSIS, COMPLETE
Bilirubin, UA: NEGATIVE
Glucose, UA: NEGATIVE
Ketones, UA: NEGATIVE
Nitrite, UA: NEGATIVE
Protein,UA: NEGATIVE
RBC, UA: NEGATIVE
Specific Gravity, UA: 1.018 (ref 1.005–1.030)
Urobilinogen, Ur: 1 mg/dL (ref 0.2–1.0)
pH, UA: 8 — ABNORMAL HIGH (ref 5.0–7.5)

## 2023-05-15 LAB — MICROSCOPIC EXAMINATION
Casts: NONE SEEN /lpf
Epithelial Cells (non renal): >10 /hpf — AB (ref 0–10)
RBC, Urine: NONE SEEN /hpf (ref 0–2)

## 2023-05-20 ENCOUNTER — Other Ambulatory Visit: Payer: Self-pay

## 2023-05-21 ENCOUNTER — Other Ambulatory Visit: Payer: Self-pay

## 2023-05-22 ENCOUNTER — Other Ambulatory Visit: Payer: Self-pay

## 2023-05-28 ENCOUNTER — Other Ambulatory Visit: Payer: Self-pay

## 2023-05-29 ENCOUNTER — Ambulatory Visit: Payer: Self-pay | Admitting: Urology

## 2023-05-29 ENCOUNTER — Encounter: Payer: Self-pay | Admitting: Urology

## 2023-05-29 VITALS — BP 109/67 | HR 79 | Ht 65.0 in | Wt 150.0 lb

## 2023-05-29 DIAGNOSIS — N200 Calculus of kidney: Secondary | ICD-10-CM

## 2023-05-29 LAB — URINALYSIS, ROUTINE W REFLEX MICROSCOPIC
Bilirubin, UA: NEGATIVE
Glucose, UA: NEGATIVE
Ketones, UA: NEGATIVE
Leukocytes,UA: NEGATIVE
Nitrite, UA: NEGATIVE
Protein,UA: NEGATIVE
RBC, UA: NEGATIVE
Specific Gravity, UA: 1.025 (ref 1.005–1.030)
Urobilinogen, Ur: 0.2 mg/dL (ref 0.2–1.0)
pH, UA: 5.5 (ref 5.0–7.5)

## 2023-05-29 NOTE — Progress Notes (Signed)
Assessment: 1. Nephrolithiasis      Plan: Today had a long and detailed discussion with the patient along with the interpreter regarding her nephrolithiasis. Stone prevention measures were discussed in great detail today and I gave her educational and supportive material in this regard.  I have also recommended that she undergo further metabolic evaluation and she is agreeable to this. Will order 24 hr urine LITHOLINK FU 8 weeks  Total time providing care during visit today=  Chief Complaint: kidney stones  History of Present Illness:  Emily Madden is a 32 y.o. female who is seen in consultation from Claiborne Rigg, NP for evaluation of nephrolithiasis.  Patient is a otherwise quite healthy 32 year old from [place] who speaks Spanish.  Her visit was performed today with a Spanish interpreter  Patient has a long history of stones dating back well over 10 years. Currently she is doing well without renal colicky pain.  She has been not passed any stones recently.  She does have some right-sided low back pain rating towards her hip which is most consistent with musculoskeletal etiology.   S/p left pcnl 2015 in [place] UA clear today  Serum Ca++ is not elevated  CT stone study 02/2023--- IMPRESSION: 1. 2 mm nonobstructing calculi in the upper pole of the right kidney and upper and lower pole of the left kidney. No evidence of hydronephrosis or ureteral calculus. 2. Bowel loops are normal in caliber. No evidence of colitis or diverticulitis. Appendix not definitely identified, however no inflammatory changes in the pericecal region. 3. Uterus and adnexa are unremarkable. 4. No CT evidence of acute abdominal/pelvic process.   Past Medical History:  Past Medical History:  Diagnosis Date   Hepatitis    Kidney stones     Past Surgical History:  Past Surgical History:  Procedure Laterality Date   KIDNEY STONE SURGERY     left kidney     Allergies:  No  Known Allergies  Family History:  Family History  Problem Relation Age of Onset   Cancer Mother    Hypertension Mother    Hyperlipidemia Mother    Breast cancer Mother    Heart disease Father    Breast cancer Maternal Aunt    Stroke Maternal Aunt    Stroke Maternal Aunt    Breast cancer Maternal Grandmother    Cancer Maternal Grandfather     Social History:  Social History   Tobacco Use   Smoking status: Never   Smokeless tobacco: Never  Vaping Use   Vaping status: Never Used  Substance Use Topics   Alcohol use: Yes    Comment: social   Drug use: No    Review of symptoms:  Constitutional:  Negative for unexplained weight loss, night sweats, fever, chills ENT:  Negative for nose bleeds, sinus pain, painful swallowing CV:  Negative for chest pain, shortness of breath, exercise intolerance, palpitations, loss of consciousness Resp:  Negative for cough, wheezing, shortness of breath GI:  Negative for nausea, vomiting, diarrhea, bloody stools GU:  Positives noted in HPI; otherwise negative for gross hematuria, dysuria, urinary incontinence Neuro:  Negative for seizures, poor balance, limb weakness, slurred speech Psych:  Negative for lack of energy, depression, anxiety Endocrine:  Negative for polydipsia, polyuria, symptoms of hypoglycemia (dizziness, hunger, sweating) Hematologic:  Negative for anemia, purpura, petechia, prolonged or excessive bleeding, use of anticoagulants  Allergic:  Negative for difficulty breathing or choking as a result of exposure to anything; no shellfish allergy; no allergic  response (rash/itch) to materials, foods  Physical exam: BP 109/67   Pulse 79   Ht 5\' 5"  (1.651 m)   Wt 150 lb (68 kg)   LMP 05/07/2023 (Exact Date)   BMI 24.96 kg/m  GENERAL APPEARANCE:  Well appearing, well developed, well nourished, NAD   Results: UA neg
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
## 2023-06-09 ENCOUNTER — Other Ambulatory Visit: Payer: Self-pay | Admitting: Urology

## 2023-06-13 LAB — LITHOLINK 24HR URINE PANEL
Ammonium, Urine: 28 mmol/(24.h) (ref 15–60)
Calcium Oxalate Saturation: 6.85 (ref 6.00–10.00)
Calcium Phosphate Saturation: 0.76 (ref 0.50–2.00)
Calcium, Urine: 200 mg/(24.h) (ref <200)
Calcium/Creatinine Ratio: 162 mg/g{creat} (ref 51–262)
Calcium/Kg Body Weight: 2.9 mg/kg/d (ref <4.0)
Chloride, Urine: 111 mmol/(24.h) (ref 70–250)
Citrate, Urine: 469 mg/(24.h) — ABNORMAL LOW (ref >550)
Creatinine, Urine: 1239 mg/(24.h)
Creatinine/Kg Body Weight: 18.2 mg/kg/d (ref 8.7–20.3)
Cystine, Urine, Qualitative: NEGATIVE
Magnesium, Urine: 36 mg/(24.h) (ref 30–120)
Oxalate, Urine: 22 mg/(24.h) (ref 20–40)
Phosphorus, Urine: 645 mg/(24.h) (ref 600–1200)
Potassium, Urine: 54 mmol/(24.h) (ref 20–100)
Protein Catabolic Rate: 0.9 g/kg/d (ref 0.8–1.4)
Sodium, Urine: 96 mmol/(24.h) (ref 50–150)
Sulfate, Urine: 31 meq/(24.h) (ref 20–80)
Urea Nitrogen, Urine: 8.2 g/24 hr (ref 6.00–14.00)
Uric Acid Saturation: 0.84 (ref <1.00)
Uric Acid, Urine: 472 mg/(24.h) (ref <750)
Urine Volume (Preserved): 1520 mL/(24.h) (ref 500–4000)
pH, 24 hr, Urine: 5.842 (ref 5.800–6.200)

## 2023-06-26 ENCOUNTER — Ambulatory Visit: Payer: Self-pay | Admitting: Urology

## 2023-06-26 ENCOUNTER — Encounter: Payer: Self-pay | Admitting: Urology

## 2023-06-26 ENCOUNTER — Other Ambulatory Visit: Payer: Self-pay

## 2023-06-26 VITALS — BP 107/71 | HR 56

## 2023-06-26 DIAGNOSIS — Z09 Encounter for follow-up examination after completed treatment for conditions other than malignant neoplasm: Secondary | ICD-10-CM

## 2023-06-26 DIAGNOSIS — N200 Calculus of kidney: Secondary | ICD-10-CM

## 2023-06-26 DIAGNOSIS — Z87442 Personal history of urinary calculi: Secondary | ICD-10-CM

## 2023-06-26 MED ORDER — POTASSIUM CITRATE ER 15 MEQ (1620 MG) PO TBCR
1.0000 | EXTENDED_RELEASE_TABLET | Freq: Two times a day (BID) | ORAL | 3 refills | Status: AC
  Filled 2023-06-26: qty 60, 30d supply, fill #0

## 2023-06-26 NOTE — Progress Notes (Signed)
   Assessment: Nephrolithiasis Hypocitraturia  Plan: Today I again had a long discussion with the patient regarding stone prevention.  We again discussed increasing fluid intake primarily water to achieve a daily urine output of 2-1/2 L.  Also discussed with her the option of potassium citrate to bump up her urine citrate for stone prevention.  Following our discussion she would like to begin treatment. Rx: Potassium citrate 15 mEq twice daily Will repeat Litholink in 8 weeks with subsequent follow-up. BMP on follow-up  Chief Complaint: Kidney stones  HPI: Emily Madden is a 32 y.o. female who presents for continued evaluation of nephrolithiasis.  Her visit today was performed with a Spanish interpreter.  Patient is here today to discuss the results of her recent 24-hour urine-Litholink for stone risk.  Significant findings were continued low urine volume and she was instructed to increase her fluid intake to achieve a goal urine output of 2.5 L daily.  Patient also has low urine citrate.  Please see my note dated 05/29/2023 at the time of initial visit for detailed history-- In summary--   Patient has a long history of stones dating back well over 10 years. Currently she is doing well without renal colicky pain.  She has been not passed any stones recently.  She does have some right-sided low back pain rating towards her hip which is most consistent with musculoskeletal etiology.     S/p left pcnl 2015 in [place] UA clear today   Serum Ca++ is not elevated   CT stone study 02/2023--- IMPRESSION: 1. 2 mm nonobstructing calculi in the upper pole of the right kidney and upper and lower pole of the left kidney. No evidence of hydronephrosis or ureteral calculus. 2. Bowel loops are normal in caliber. No evidence of colitis or diverticulitis. Appendix not definitely identified, however no inflammatory changes in the pericecal region. 3. Uterus and adnexa are unremarkable. 4. No  CT evidence of acute abdominal/pelvic process.  Portions of the above documentation were copied from a prior visit for review purposes only.  Allergies: No Known Allergies  PMH: Past Medical History:  Diagnosis Date   Hepatitis    Kidney stones     PSH: Past Surgical History:  Procedure Laterality Date   KIDNEY STONE SURGERY     left kidney     SH: Social History   Tobacco Use   Smoking status: Never   Smokeless tobacco: Never  Vaping Use   Vaping status: Never Used  Substance Use Topics   Alcohol use: Yes    Comment: social   Drug use: No    ROS: Constitutional:  Negative for fever, chills, weight loss CV: Negative for chest pain, previous MI, hypertension Respiratory:  Negative for shortness of breath, wheezing, sleep apnea, frequent cough GI:  Negative for nausea, vomiting, bloody stool, GERD  PE: BP 107/71   Pulse (!) 56  GENERAL APPEARANCE:  Well appearing, well developed, well nourished, NAD

## 2023-06-27 ENCOUNTER — Other Ambulatory Visit: Payer: Self-pay

## 2023-07-03 ENCOUNTER — Other Ambulatory Visit: Payer: Self-pay

## 2023-07-07 LAB — URINALYSIS, ROUTINE W REFLEX MICROSCOPIC
Bilirubin, UA: NEGATIVE
Glucose, UA: NEGATIVE
Ketones, UA: NEGATIVE
Leukocytes,UA: NEGATIVE
Nitrite, UA: NEGATIVE
Protein,UA: NEGATIVE
Specific Gravity, UA: 1.01 (ref 1.005–1.030)
Urobilinogen, Ur: 0.2 mg/dL (ref 0.2–1.0)
pH, UA: 5.5 (ref 5.0–7.5)

## 2023-07-07 LAB — MICROSCOPIC EXAMINATION: RBC, Urine: >30 /HPF — AB (ref 0–2)

## 2023-07-09 ENCOUNTER — Encounter: Payer: Self-pay | Admitting: Nurse Practitioner

## 2023-07-29 ENCOUNTER — Ambulatory Visit: Payer: Self-pay | Admitting: Urology

## 2023-08-05 ENCOUNTER — Encounter: Payer: Self-pay | Admitting: Nurse Practitioner

## 2023-08-19 ENCOUNTER — Ambulatory Visit: Payer: Self-pay | Attending: Nurse Practitioner | Admitting: Nurse Practitioner

## 2023-08-19 ENCOUNTER — Encounter: Payer: Self-pay | Admitting: Nurse Practitioner

## 2023-08-19 VITALS — BP 100/64 | HR 73 | Ht 65.0 in | Wt 151.4 lb

## 2023-08-19 DIAGNOSIS — Z0000 Encounter for general adult medical examination without abnormal findings: Secondary | ICD-10-CM | POA: Insufficient documentation

## 2023-08-19 NOTE — Progress Notes (Signed)
Felix Ahmadi interpreter
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
## 2023-09-04 ENCOUNTER — Ambulatory Visit: Payer: Self-pay | Admitting: Urology

## 2023-10-07 ENCOUNTER — Other Ambulatory Visit: Payer: Self-pay

## 2023-10-07 ENCOUNTER — Emergency Department (HOSPITAL_COMMUNITY)
Admission: EM | Admit: 2023-10-07 | Discharge: 2023-10-07 | Disposition: A | Discharge status: Home / Self Care | Payer: Self-pay | Attending: Emergency Medicine | Admitting: Emergency Medicine

## 2023-10-07 ENCOUNTER — Encounter (HOSPITAL_COMMUNITY): Payer: Self-pay

## 2023-10-07 ENCOUNTER — Emergency Department (HOSPITAL_COMMUNITY): Payer: Self-pay

## 2023-10-07 DIAGNOSIS — R109 Unspecified abdominal pain: Secondary | ICD-10-CM

## 2023-10-07 DIAGNOSIS — D259 Leiomyoma of uterus, unspecified: Secondary | ICD-10-CM | POA: Insufficient documentation

## 2023-10-07 DIAGNOSIS — D219 Benign neoplasm of connective and other soft tissue, unspecified: Secondary | ICD-10-CM

## 2023-10-07 DIAGNOSIS — Z79899 Other long term (current) drug therapy: Secondary | ICD-10-CM | POA: Insufficient documentation

## 2023-10-07 LAB — COMPREHENSIVE METABOLIC PANEL
ALT: 12 U/L (ref 0–44)
AST: 17 U/L (ref 15–41)
Albumin: 4.1 g/dL (ref 3.5–5.0)
Alkaline Phosphatase: 87 U/L (ref 38–126)
Anion gap: 7 (ref 5–15)
BUN: 11 mg/dL (ref 6–20)
CO2: 22 mmol/L (ref 22–32)
Calcium: 8.6 mg/dL — ABNORMAL LOW (ref 8.9–10.3)
Chloride: 103 mmol/L (ref 98–111)
Creatinine, Ser: 0.68 mg/dL (ref 0.44–1.00)
GFR, Estimated: >60 mL/min (ref >60)
Glucose, Bld: 98 mg/dL (ref 70–99)
Potassium: 3.7 mmol/L (ref 3.5–5.1)
Sodium: 132 mmol/L — ABNORMAL LOW (ref 135–145)
Total Bilirubin: 0.6 mg/dL (ref <1.2)
Total Protein: 7.8 g/dL (ref 6.5–8.1)

## 2023-10-07 LAB — URINALYSIS, ROUTINE W REFLEX MICROSCOPIC
Bilirubin Urine: NEGATIVE
Glucose, UA: NEGATIVE mg/dL
Ketones, ur: NEGATIVE mg/dL
Leukocytes,Ua: NEGATIVE
Nitrite: NEGATIVE
Protein, ur: NEGATIVE mg/dL
Specific Gravity, Urine: 1.01 (ref 1.005–1.030)
pH: 6 (ref 5.0–8.0)

## 2023-10-07 LAB — CBC
HCT: 39 % (ref 36.0–46.0)
Hemoglobin: 12.7 g/dL (ref 12.0–15.0)
MCH: 28.2 pg (ref 26.0–34.0)
MCHC: 32.6 g/dL (ref 30.0–36.0)
MCV: 86.5 fL (ref 80.0–100.0)
Platelets: 336 10*3/uL (ref 150–400)
RBC: 4.51 MIL/uL (ref 3.87–5.11)
RDW: 12.9 % (ref 11.5–15.5)
WBC: 8.4 10*3/uL (ref 4.0–10.5)
nRBC: 0 % (ref 0.0–0.2)

## 2023-10-07 LAB — PREGNANCY, URINE: Preg Test, Ur: NEGATIVE

## 2023-10-07 LAB — LIPASE, BLOOD: Lipase: 33 U/L (ref 11–51)

## 2023-10-07 MED ORDER — ONDANSETRON 4 MG PO TBDP
4.0000 mg | ORAL_TABLET | Freq: Once | ORAL | Status: AC
  Administered 2023-10-07: 4 mg via ORAL
  Filled 2023-10-07: qty 1

## 2023-10-07 MED ORDER — MORPHINE SULFATE (PF) 4 MG/ML IV SOLN
4.0000 mg | Freq: Once | INTRAVENOUS | Status: AC
  Administered 2023-10-07: 4 mg via INTRAVENOUS
  Filled 2023-10-07: qty 1

## 2023-10-07 MED ORDER — IBUPROFEN 800 MG PO TABS
800.0000 mg | ORAL_TABLET | Freq: Three times a day (TID) | ORAL | 0 refills | Status: AC | PRN
  Filled 2023-10-07: qty 21, 7d supply, fill #0

## 2023-10-07 MED ORDER — IOHEXOL 300 MG/ML  SOLN
100.0000 mL | Freq: Once | INTRAMUSCULAR | Status: AC | PRN
  Administered 2023-10-07: 100 mL via INTRAVENOUS

## 2023-10-07 MED ORDER — ONDANSETRON 4 MG PO TBDP
4.0000 mg | ORAL_TABLET | Freq: Three times a day (TID) | ORAL | 0 refills | Status: AC | PRN
  Filled 2023-10-07: qty 20, 7d supply, fill #0

## 2023-10-07 MED ORDER — IBUPROFEN 800 MG PO TABS
800.0000 mg | ORAL_TABLET | Freq: Once | ORAL | Status: AC
  Administered 2023-10-07: 800 mg via ORAL
  Filled 2023-10-07: qty 1

## 2023-10-07 MED ORDER — DICYCLOMINE HCL 20 MG PO TABS
20.0000 mg | ORAL_TABLET | Freq: Three times a day (TID) | ORAL | 0 refills | Status: AC | PRN
  Filled 2023-10-07: qty 20, 7d supply, fill #0

## 2023-10-07 MED ORDER — SODIUM CHLORIDE 0.9 % IV BOLUS
1000.0000 mL | Freq: Once | INTRAVENOUS | Status: AC
  Administered 2023-10-07: 1000 mL via INTRAVENOUS

## 2023-10-07 MED ORDER — OXYCODONE-ACETAMINOPHEN 5-325 MG PO TABS
1.0000 | ORAL_TABLET | ORAL | Status: DC | PRN
  Administered 2023-10-07: 1 via ORAL
  Filled 2023-10-07: qty 1

## 2023-10-07 NOTE — ED Provider Notes (Signed)
Emergency Department Provider Note   I have reviewed the triage vital signs and the nursing notes.   HISTORY  Chief Complaint Flank Pain   HPI Emily Madden Emily Madden is a 32 y.o. female past history reviewed below including prior ureteral stones presents to the emergency department with acute onset right flank pain radiating to the right lower abdomen.  Symptoms began 1 hour prior to arrival.  She notes a similar, severe episode yesterday but pain increased today.  She took ibuprofen at home with no relief in symptoms.  No fevers or chills.  No burning with urination.   Past Medical History:  Diagnosis Date   Hepatitis    Kidney stones     Review of Systems  Constitutional: No fever/chills. Positive dizziness.  Cardiovascular: Denies chest pain. Respiratory: Denies shortness of breath. Gastrointestinal: Positive RLQ abdominal pain. Positive nausea, no vomiting.  Musculoskeletal: Negative for back pain. Skin: Negative for rash. Neurological: Negative for headaches.  ____________________________________________   PHYSICAL EXAM:  VITAL SIGNS: ED Triage Vitals  Encounter Vitals Group     BP 10/07/23 1605 108/75     Pulse Rate 10/07/23 1605 63     Resp 10/07/23 1605 18     Temp 10/07/23 1605 98 F (36.7 C)     Temp Source 10/07/23 1605 Oral     SpO2 10/07/23 1605 100 %     Weight 10/07/23 1611 151 lb 7.3 oz (68.7 kg)     Height 10/07/23 1611 5\' 5"  (1.651 m)   Constitutional: Alert and oriented. Well appearing and in no acute distress. Eyes: Conjunctivae are normal.  Head: Atraumatic. Nose: No congestion/rhinnorhea. Mouth/Throat: Mucous membranes are moist.   Neck: No stridor.   Cardiovascular: Normal rate, regular rhythm. Good peripheral circulation. Grossly normal heart sounds.   Respiratory: Normal respiratory effort.  No retractions. Lungs CTAB. Gastrointestinal: Soft with mild RLQ tenderness. No rebound or guarding. No distention.  Musculoskeletal:  No gross  deformities of extremities. Neurologic:  Normal speech and language.  Skin:  Skin is warm, dry and intact. No rash noted.  ____________________________________________   LABS (all labs ordered are listed, but only abnormal results are displayed)  Labs Reviewed  COMPREHENSIVE METABOLIC PANEL - Abnormal; Notable for the following components:      Result Value   Sodium 132 (*)    Calcium 8.6 (*)    All other components within normal limits  URINALYSIS, ROUTINE W REFLEX MICROSCOPIC - Abnormal; Notable for the following components:   Hgb urine dipstick MODERATE (*)    Bacteria, UA RARE (*)    All other components within normal limits  CBC  LIPASE, BLOOD  PREGNANCY, URINE   ____________________________________________  RADIOLOGY  CT ABDOMEN PELVIS W CONTRAST  Result Date: 10/07/2023 CLINICAL DATA:  Right lower quadrant pain EXAM: CT ABDOMEN AND PELVIS WITH CONTRAST TECHNIQUE: Multidetector CT imaging of the abdomen and pelvis was performed using the standard protocol following bolus administration of intravenous contrast. RADIATION DOSE REDUCTION: This exam was performed according to the departmental dose-optimization program which includes automated exposure control, adjustment of the mA and/or kV according to patient size and/or use of iterative reconstruction technique. CONTRAST:  OMNIPAQUE IOHEXOL 300 MG/ML  SOLN COMPARISON:  03/25/2023 FINDINGS: Lower chest: No acute abnormality. Hepatobiliary: No focal hepatic abnormality. Gallbladder unremarkable. Pancreas: No focal abnormality or ductal dilatation. Spleen: No focal abnormality.  Normal size. Adrenals/Urinary Tract: No adrenal abnormality. No focal renal abnormality. No stones or hydronephrosis. Urinary bladder is unremarkable. Stomach/Bowel: Appendix is  normal. Stomach, large and small bowel grossly unremarkable. Vascular/Lymphatic: No evidence of aneurysm or adenopathy. Reproductive: Fibroid uterus with pedunculated right  fundal fibroid measuring 2.2 cm. No adnexal mass. Other: No free fluid or free air. Musculoskeletal: No acute bony abnormality. IMPRESSION: No acute findings in the abdomen or pelvis. Fibroid uterus. Electronically Signed   By: Charlett Nose M.D.   On: 10/07/2023 22:23    ____________________________________________   PROCEDURES  Procedure(s) performed:   Procedures  None ____________________________________________   INITIAL IMPRESSION / ASSESSMENT AND PLAN / ED COURSE  Pertinent labs & imaging results that were available during my care of the patient were reviewed by me and considered in my medical decision making (see chart for details).   This patient is Presenting for Evaluation of abdominal pain, which does require a range of treatment options, and is a complaint that involves a high risk of morbidity and mortality.  The Differential Diagnoses includes but is not exclusive to ectopic pregnancy, ovarian cyst, ovarian torsion, acute appendicitis, urinary tract infection, endometriosis, bowel obstruction, hernia, colitis, renal colic, gastroenteritis, volvulus etc.   Critical Interventions-    Medications  oxyCODONE-acetaminophen (PERCOCET/ROXICET) 5-325 MG per tablet 1 tablet (1 tablet Oral Given 10/07/23 1618)  ondansetron (ZOFRAN-ODT) disintegrating tablet 4 mg (4 mg Oral Given 10/07/23 1618)  morphine (PF) 4 MG/ML injection 4 mg (4 mg Intravenous Given 10/07/23 1943)  sodium chloride 0.9 % bolus 1,000 mL (0 mLs Intravenous Stopped 10/07/23 2230)  iohexol (OMNIPAQUE) 300 MG/ML solution 100 mL (100 mLs Intravenous Contrast Given 10/07/23 2157)  ibuprofen (ADVIL) tablet 800 mg (800 mg Oral Given 10/07/23 2245)    Reassessment after intervention: pain improved.   Clinical Laboratory Tests Ordered, included UA without evidence of infection.  CMP with bilirubin and LFTs normal.  Lipase negative.  CBC without anemia. No leukocytosis.   Radiologic Tests Ordered, included CT  abdomen/pelvis. I independently interpreted the images and agree with radiology interpretation.   Cardiac Monitor Tracing which shows NSR.    Social Determinants of Health Risk patient is a non-smoker.   Medical Decision Making: Summary:  Patient presents emergency department for evaluation of right lower quadrant abdominal pain.  Has had a previous ED presentation and chart review for similar with reassuring CT at that time.  Seems more focally tender in the right lower quadrant on my evaluation today.  Ureteral stone is possible but plan for CT with contrast for better characterization of pain and alternate etiology.   Reevaluation with update and discussion with patient using spanish interpreter. CT without acute findings. UA without infection. Normal adnexa on CT. Exam is reassuring. Exceedingly low suspicion for torsion. Will defer pelvic US for now. Plan for symptom mgmt and Gyn follow up. Fibroid noted on earlier CT. Will benefit from establishing care with a Gyn.    Patient's presentation is most consistent with acute presentation with potential threat to life or bodily function.   Disposition: discharge  ____________________________________________  FINAL CLINICAL IMPRESSION(S) / ED DIAGNOSES  Final diagnoses:  Right flank pain  Fibroids     NEW OUTPATIENT MEDICATIONS STARTED DURING THIS VISIT:  Discharge Medication List as of 10/07/2023 10:47 PM     START taking these medications   Details  dicyclomine (BENTYL) 20 MG tablet Take 1 tablet (20 mg total) by mouth 3 (three) times daily as needed., Starting Tue 10/07/2023, Normal    ondansetron (ZOFRAN-ODT) 4 MG disintegrating tablet Take 1 tablet (4 mg total) by mouth every 8 (eight) hours as needed.,  Starting Tue 10/07/2023, Normal        Note:  This document was prepared using Dragon voice recognition software and may include unintentional dictation errors.  Alona Bene, MD, [facility] Emergency Medicine    Keighley Deckman,  Arlyss Repress, MD 10/07/23 [phone]

## 2023-10-07 NOTE — ED Triage Notes (Signed)
Patient reports right flank pain, nausea, and dizziness x 1 hr. Similar episode happened yesterday, but was not as severe. Took ibuprofen without relief.

## 2023-10-08 ENCOUNTER — Other Ambulatory Visit: Payer: Self-pay
